# Patient Record
Sex: Male | Born: 1965 | Race: Black or African American | Hispanic: No | Marital: Married | Smoking: Never smoker
Health system: Southern US, Community
[De-identification: ages and names within clinical notes are randomized; demographics above are authoritative.]

## PROBLEM LIST (undated history)

## (undated) DIAGNOSIS — I10 Essential (primary) hypertension: Secondary | ICD-10-CM

## (undated) DIAGNOSIS — E785 Hyperlipidemia, unspecified: Secondary | ICD-10-CM

## (undated) DIAGNOSIS — I341 Nonrheumatic mitral (valve) prolapse: Secondary | ICD-10-CM

## (undated) DIAGNOSIS — E119 Type 2 diabetes mellitus without complications: Secondary | ICD-10-CM

## (undated) HISTORY — DX: Hyperlipidemia, unspecified: E78.5

## (undated) HISTORY — DX: Essential (primary) hypertension: I10

## (undated) HISTORY — PX: NO PAST SURGERIES: SHX2092

## (undated) HISTORY — DX: Type 2 diabetes mellitus without complications: E11.9

---

## 2014-10-14 ENCOUNTER — Encounter: Payer: Self-pay | Admitting: Family Medicine

## 2014-10-14 ENCOUNTER — Ambulatory Visit (INDEPENDENT_AMBULATORY_CARE_PROVIDER_SITE_OTHER): Payer: BLUE CROSS/BLUE SHIELD | Admitting: Family Medicine

## 2014-10-14 VITALS — BP 124/80 | HR 73 | Temp 97.9°F | Resp 18 | Ht 65.0 in | Wt 183.2 lb

## 2014-10-14 DIAGNOSIS — Z0000 Encounter for general adult medical examination without abnormal findings: Secondary | ICD-10-CM

## 2014-10-14 MED ORDER — ASPIRIN EC 81 MG PO TBEC: 81.0000 mg | DELAYED_RELEASE_TABLET | Freq: Every day | ORAL | Status: DC

## 2014-10-14 NOTE — Progress Notes (Signed)
Name: Jack Cardenas   MRN: [id number]    DOB: 1965/09/01   Date:10/14/2014       Progress Note  Subjective  Chief Complaint  Chief Complaint  Patient presents with  . Annual Exam    HPI  Patient presents for annual H&P. He is followed by endocrinologist for his diabetes hypertension hyperlipidemia and these are stable.  Past Medical History  Diagnosis Date  . Hyperlipidemia   . Hypertension   . Diabetes mellitus without complication     Social History  Substance Use Topics  . Smoking status: Never Smoker   . Smokeless tobacco: Never Used  . Alcohol Use: No     Current outpatient prescriptions:  .  amLODipine (NORVASC) 5 MG tablet, , Disp: , Rfl:  .  aspirin EC 81 MG tablet, Take 1 tablet (81 mg total) by mouth daily., Disp: 30 tablet, Rfl: 12 .  atorvastatin (LIPITOR) 40 MG tablet, , Disp: , Rfl:  .  LANTUS SOLOSTAR 100 UNIT/ML Solostar Pen, , Disp: , Rfl:  .  ramipril (ALTACE) 10 MG capsule, , Disp: , Rfl:  .  ZETIA 10 MG tablet, , Disp: , Rfl:   No Known Allergies  Review of Systems  Constitutional: Negative for fever, chills and weight loss.  HENT: Negative for congestion, hearing loss, sore throat and tinnitus.   Eyes: Negative for blurred vision, double vision and redness.  Respiratory: Negative for cough, hemoptysis and shortness of breath.   Cardiovascular: Negative for chest pain, palpitations, orthopnea, claudication and leg swelling.  Gastrointestinal: Negative for heartburn, nausea, vomiting, diarrhea, constipation and blood in stool.  Genitourinary: Negative for dysuria, urgency, frequency and hematuria.  Musculoskeletal: Negative for myalgias, back pain, joint pain, falls and neck pain.  Skin: Negative for itching.  Neurological: Negative for dizziness, tingling, tremors, focal weakness, seizures, loss of consciousness, weakness and headaches.  Endo/Heme/Allergies: Does not bruise/bleed easily.  Psychiatric/Behavioral: Negative for depression and  substance abuse. The patient is not nervous/anxious and does not have insomnia.      Objective  Filed Vitals:   10/14/14 0931  BP: 124/80  Pulse: 73  Temp: 97.9 F (36.6 C)  TempSrc: Oral  Resp: 18  Height:  (1.651 m)  Weight: 183 lb 3.2 oz (83.099 kg)  SpO2: 97%     Physical Exam  Constitutional: He is oriented to person, place, and time and well-developed, well-nourished, and in no distress.  HENT:  Head: Normocephalic.  Eyes: EOM are normal. Pupils are equal, round, and reactive to light.  Neck: Normal range of motion. Neck supple. No thyromegaly present.  Cardiovascular: Normal rate, regular rhythm and normal heart sounds.   No murmur heard. Pulmonary/Chest: Effort normal and breath sounds normal. No respiratory distress. He has no wheezes.  Abdominal: Soft. Bowel sounds are normal.  Genitourinary: Rectum normal, prostate normal and penis normal. Guaiac negative stool. No discharge found.  Musculoskeletal: Normal range of motion. He exhibits no edema.  Lymphadenopathy:    He has no cervical adenopathy.  Neurological: He is alert and oriented to person, place, and time. No cranial nerve deficit. Gait normal. Coordination normal.  Skin: Skin is warm and dry. No rash noted.  Psychiatric: Affect and judgment normal.      Assessment & Plan  1. Annual physical exam  - CBC - Comprehensive Metabolic Panel (CMET) - Lipid panel - PSA - Ambulatory referral to Colorectal Surgery

## 2014-10-14 NOTE — Patient Instructions (Signed)
Schedule for colonoscopy 

## 2014-10-15 LAB — COMPREHENSIVE METABOLIC PANEL
A/G RATIO: 2.1 (ref 1.1–2.5)
ALT: 35 IU/L (ref 0–44)
AST: 35 IU/L (ref 0–40)
Albumin: 4.6 g/dL (ref 3.5–5.5)
Alkaline Phosphatase: 72 IU/L (ref 39–117)
BILIRUBIN TOTAL: 0.8 mg/dL (ref 0.0–1.2)
BUN/Creatinine Ratio: 14 (ref 9–20)
BUN: 19 mg/dL (ref 6–24)
CHLORIDE: 101 mmol/L (ref 97–108)
CO2: 28 mmol/L (ref 18–29)
Calcium: 9.9 mg/dL (ref 8.7–10.2)
Creatinine, Ser: 1.36 mg/dL — ABNORMAL HIGH (ref 0.76–1.27)
GFR calc non Af Amer: 60 mL/min/{1.73_m2} (ref >59)
GFR, EST AFRICAN AMERICAN: 70 mL/min/{1.73_m2} (ref >59)
Globulin, Total: 2.2 g/dL (ref 1.5–4.5)
Glucose: 100 mg/dL — ABNORMAL HIGH (ref 65–99)
POTASSIUM: 5.1 mmol/L (ref 3.5–5.2)
SODIUM: 143 mmol/L (ref 134–144)
Total Protein: 6.8 g/dL (ref 6.0–8.5)

## 2014-10-15 LAB — CBC
Hematocrit: 45.9 % (ref 37.5–51.0)
Hemoglobin: 15 g/dL (ref 12.6–17.7)
MCH: 26 pg — AB (ref 26.6–33.0)
MCHC: 32.7 g/dL (ref 31.5–35.7)
MCV: 80 fL (ref 79–97)
PLATELETS: 176 10*3/uL (ref 150–379)
RBC: 5.77 x10E6/uL (ref 4.14–5.80)
RDW: 14.1 % (ref 12.3–15.4)
WBC: 3.5 10*3/uL (ref 3.4–10.8)

## 2014-10-15 LAB — PSA: Prostate Specific Ag, Serum: 0.5 ng/mL (ref 0.0–4.0)

## 2014-10-19 ENCOUNTER — Telehealth: Payer: Self-pay | Admitting: Gastroenterology

## 2014-10-19 ENCOUNTER — Other Ambulatory Visit: Payer: Self-pay

## 2014-10-19 NOTE — Telephone Encounter (Signed)
Gastroenterology Pre-Procedure Review  Request Date: 11-06-2014 Requesting Physician: Dr. Thana Ates  PATIENT REVIEW QUESTIONS: The patient responded to the following health history questions as indicated:    1. Are you having any GI issues? no 2. Do you have a personal history of Polyps? no 3. Do you have a family history of Colon Cancer or Polyps? no 4. Diabetes Mellitus? yes ( ) 5. Joint replacements in the past 12 months?no 6. Major health problems in the past 3 months?no 7. Any artificial heart valves, MVP, or defibrillator?no    MEDICATIONS & ALLERGIES:    Patient reports the following regarding taking any anticoagulation/antiplatelet therapy:   Plavix, Coumadin, Eliquis, Xarelto, Lovenox, Pradaxa, Brilinta, or Effient? no Aspirin? yes (Daily)  Patient confirms/reports the following medications:  Current Outpatient Prescriptions  Medication Sig Dispense Refill   amLODipine (NORVASC) 5 MG tablet      aspirin EC 81 MG tablet Take 1 tablet (81 mg total) by mouth daily. 30 tablet 12   atorvastatin (LIPITOR) 40 MG tablet      LANTUS SOLOSTAR 100 UNIT/ML Solostar Pen      ramipril (ALTACE) 10 MG capsule      ZETIA 10 MG tablet      No current facility-administered medications for this visit.    Patient confirms/reports the following allergies:  No Known Allergies  No orders of the defined types were placed in this encounter.    AUTHORIZATION INFORMATION Primary Insurance: 1D#: Group #:  Secondary Insurance: 1D#: Group #:  SCHEDULE INFORMATION: Date: 11-06-2014 Time: Location:[facility]

## 2014-12-07 ENCOUNTER — Encounter: Payer: Self-pay | Admitting: *Deleted

## 2014-12-09 NOTE — Discharge Instructions (Signed)

## 2014-12-11 ENCOUNTER — Encounter
Admission: RE | Disposition: A | Discharge status: Home / Self Care | Payer: Self-pay | Source: Ambulatory Visit | Attending: Gastroenterology

## 2014-12-11 ENCOUNTER — Ambulatory Visit: Payer: BLUE CROSS/BLUE SHIELD | Admitting: Anesthesiology

## 2014-12-11 ENCOUNTER — Ambulatory Visit
Admission: RE | Admit: 2014-12-11 | Discharge: 2014-12-11 | Disposition: A | Discharge status: Home / Self Care | Payer: BLUE CROSS/BLUE SHIELD | Source: Ambulatory Visit | Attending: Gastroenterology | Admitting: Gastroenterology

## 2014-12-11 DIAGNOSIS — Z794 Long term (current) use of insulin: Secondary | ICD-10-CM | POA: Diagnosis not present

## 2014-12-11 DIAGNOSIS — E119 Type 2 diabetes mellitus without complications: Secondary | ICD-10-CM | POA: Insufficient documentation

## 2014-12-11 DIAGNOSIS — Z79899 Other long term (current) drug therapy: Secondary | ICD-10-CM | POA: Diagnosis not present

## 2014-12-11 DIAGNOSIS — Z1211 Encounter for screening for malignant neoplasm of colon: Secondary | ICD-10-CM | POA: Diagnosis not present

## 2014-12-11 DIAGNOSIS — K640 First degree hemorrhoids: Secondary | ICD-10-CM | POA: Diagnosis not present

## 2014-12-11 DIAGNOSIS — Z7982 Long term (current) use of aspirin: Secondary | ICD-10-CM | POA: Insufficient documentation

## 2014-12-11 DIAGNOSIS — I10 Essential (primary) hypertension: Secondary | ICD-10-CM | POA: Diagnosis not present

## 2014-12-11 DIAGNOSIS — I341 Nonrheumatic mitral (valve) prolapse: Secondary | ICD-10-CM | POA: Diagnosis not present

## 2014-12-11 DIAGNOSIS — E785 Hyperlipidemia, unspecified: Secondary | ICD-10-CM | POA: Diagnosis not present

## 2014-12-11 HISTORY — PX: COLONOSCOPY WITH PROPOFOL: SHX5780

## 2014-12-11 HISTORY — DX: Nonrheumatic mitral (valve) prolapse: I34.1

## 2014-12-11 LAB — GLUCOSE, CAPILLARY: GLUCOSE-CAPILLARY: 88 mg/dL (ref 65–99)

## 2014-12-11 SURGERY — COLONOSCOPY WITH PROPOFOL
Anesthesia: Monitor Anesthesia Care

## 2014-12-11 MED ORDER — STERILE WATER FOR IRRIGATION IR SOLN
Status: DC | PRN
  Administered 2014-12-11: 08:00:00

## 2014-12-11 MED ORDER — LACTATED RINGERS IV SOLN
INTRAVENOUS | Status: DC
  Administered 2014-12-11 (×2): via INTRAVENOUS

## 2014-12-11 MED ORDER — PROPOFOL 10 MG/ML IV BOLUS
INTRAVENOUS | Status: DC | PRN
  Administered 2014-12-11: 40 mg via INTRAVENOUS
  Administered 2014-12-11: 100 mg via INTRAVENOUS
  Administered 2014-12-11: 20 mg via INTRAVENOUS
  Administered 2014-12-11: 40 mg via INTRAVENOUS

## 2014-12-11 MED ORDER — LIDOCAINE HCL (CARDIAC) 20 MG/ML IV SOLN
INTRAVENOUS | Status: DC | PRN
  Administered 2014-12-11: 30 mg via INTRAVENOUS

## 2014-12-11 SURGICAL SUPPLY — 29 items
CANISTER SUCT 1200ML W/VALVE (MISCELLANEOUS) ×2 IMPLANT
FCP ESCP3.2XJMB 240X2.8X (MISCELLANEOUS)
FORCEPS BIOP RAD 4 LRG CAP 4 (CUTTING FORCEPS) IMPLANT
FORCEPS BIOP RJ4 240 W/NDL (MISCELLANEOUS)
FORCEPS ESCP3.2XJMB 240X2.8X (MISCELLANEOUS) IMPLANT
GOWN CVR UNV OPN BCK APRN NK (MISCELLANEOUS) ×2 IMPLANT
GOWN ISOL THUMB LOOP REG UNIV (MISCELLANEOUS) ×2
HEMOCLIP INSTINCT (CLIP) IMPLANT
INJECTOR VARIJECT VIN23 (MISCELLANEOUS) IMPLANT
KIT CO2 TUBING (TUBING) IMPLANT
KIT DEFENDO VALVE AND CONN (KITS) IMPLANT
KIT ENDO PROCEDURE OLY (KITS) ×2 IMPLANT
KIT ROOM TURNOVER OR (KITS) ×2 IMPLANT
LIGATOR MULTIBAND 6SHOOTER MBL (MISCELLANEOUS) IMPLANT
MARKER SPOT ENDO TATTOO 5ML (MISCELLANEOUS) IMPLANT
PAD GROUND ADULT SPLIT (MISCELLANEOUS) IMPLANT
SNARE SHORT THROW 13M SML OVAL (MISCELLANEOUS) IMPLANT
SNARE SHORT THROW 30M LRG OVAL (MISCELLANEOUS) IMPLANT
SPOT EX ENDOSCOPIC TATTOO (MISCELLANEOUS)
SUCTION POLY TRAP 4CHAMBER (MISCELLANEOUS) IMPLANT
TRAP SUCTION POLY (MISCELLANEOUS) IMPLANT
TUBING CONN 6MMX3.1M (TUBING)
TUBING SUCTION CONN 0.25 STRL (TUBING) IMPLANT
UNDERPAD 30X60 958B10 (PK) (MISCELLANEOUS) IMPLANT
VALVE BIOPSY ENDO (VALVE) IMPLANT
VARIJECT INJECTOR VIN23 (MISCELLANEOUS)
WATER AUXILLARY (MISCELLANEOUS) IMPLANT
WATER STERILE IRR 250ML POUR (IV SOLUTION) ×2 IMPLANT
WATER STERILE IRR 500ML POUR (IV SOLUTION) IMPLANT

## 2014-12-11 NOTE — Anesthesia Postprocedure Evaluation (Signed)
  Anesthesia Post-op Note  Patient: Jack Cardenas  Procedure(s) Performed: Procedure(s) with comments: COLONOSCOPY WITH PROPOFOL (N/A) - Diabetic - insulin  Anesthesia type:MAC  Patient location: PACU  Post pain: Pain level controlled  Post assessment: Post-op Vital signs reviewed, Patient's Cardiovascular Status Stable, Respiratory Function Stable, Patent Airway and No signs of Nausea or vomiting  Post vital signs: Reviewed and stable  Last Vitals:  Filed Vitals:   12/11/14 0810  BP:   Pulse: 74  Temp:   Resp: 14    Level of consciousness: awake, alert  and patient cooperative  Complications: No apparent anesthesia complications

## 2014-12-11 NOTE — Anesthesia Preprocedure Evaluation (Signed)
Anesthesia Evaluation  Patient identified by MRN, date of birth, ID band Patient awake    Reviewed: Allergy & Precautions, H&P , NPO status , Patient's Chart, lab work & pertinent test results, reviewed documented beta blocker date and time   Airway Mallampati: II  TM Distance: >3 FB Neck ROM: full    Dental no notable dental hx.    Pulmonary neg pulmonary ROS,    Pulmonary exam normal breath sounds clear to auscultation       Cardiovascular Exercise Tolerance: Good hypertension,  Rhythm:regular Rate:Normal     Neuro/Psych negative neurological ROS  negative psych ROS   GI/Hepatic negative GI ROS, Neg liver ROS,   Endo/Other  diabetes  Renal/GU negative Renal ROS  negative genitourinary   Musculoskeletal   Abdominal   Peds  Hematology negative hematology ROS (+)   Anesthesia Other Findings   Reproductive/Obstetrics negative OB ROS                             Anesthesia Physical Anesthesia Plan  ASA: II  Anesthesia Plan: MAC   Post-op Pain Management:    Induction:   Airway Management Planned:   Additional Equipment:   Intra-op Plan:   Post-operative Plan:   Informed Consent: I have reviewed the patients History and Physical, chart, labs and discussed the procedure including the risks, benefits and alternatives for the proposed anesthesia with the patient or authorized representative who has indicated his/her understanding and acceptance.   Dental Advisory Given  Plan Discussed with: CRNA  Anesthesia Plan Comments:         Anesthesia Quick Evaluation  

## 2014-12-11 NOTE — H&P (Signed)
  [facility]  [address] [place], [place] [address] Phone: [phone] Fax : [phone]  Primary Care Physician:  Jack MascotLemont Morrisey, MD Primary Gastroenterologist:  Dr. Servando SnareWohl  Pre-Procedure History & Physical: HPI:  Jack Cardenas is a 49 y.o. male is here for a screening colonoscopy.   Past Medical History  Diagnosis Date  . Hyperlipidemia   . Hypertension   . Diabetes mellitus without complication (HCC)   . MVP (mitral valve prolapse)     Dx'd as teenager. No issues.  Followed by PCP.    Past Surgical History  Procedure Laterality Date  . No past surgeries      Prior to Admission medications   Medication Sig Start Date End Date Taking? Authorizing Provider  amLODipine (NORVASC) 5 MG tablet  07/14/14  Yes Historical Provider, MD  aspirin EC 81 MG tablet Take 1 tablet (81 mg total) by mouth daily. 10/14/14  Yes Jack MascotLemont Morrisey, MD  atorvastatin (LIPITOR) 40 MG tablet  07/14/14  Yes Historical Provider, MD  Cholecalciferol (VITAMIN D PO) Take by mouth.   Yes Historical Provider, MD  Multiple Vitamin (MULTIVITAMIN) tablet Take 1 tablet by mouth daily.   Yes Historical Provider, MD  Omega-3 Fatty Acids (FISH OIL PO) Take by mouth.   Yes Historical Provider, MD  ZETIA 10 MG tablet  07/14/14  Yes Historical Provider, MD  LANTUS SOLOSTAR 100 UNIT/ML Solostar Pen  07/14/14   Historical Provider, MD  ramipril (ALTACE) 10 MG capsule  07/14/14   Historical Provider, MD    Allergies as of 10/19/2014  . (No Known Allergies)    History reviewed. No pertinent family history.  Social History   Social History  . Marital Status: Married    Spouse Name: N/A  . Number of Children: N/A  . Years of Education: N/A   Occupational History  . Not on file.   Social History Main Topics  . Smoking status: Never Smoker   . Smokeless tobacco: Never Used  . Alcohol Use: No  . Drug Use: No  . Sexual Activity:    Partners: Female   Other Topics Concern  . Not on file     Social History Narrative    Review of Systems: See HPI, otherwise negative ROS  Physical Exam: BP 129/96 mmHg  Pulse 74  Temp(Src) 97.9 F (36.6 C) (Temporal)  Resp 17  Ht 5\' 5"  (1.651 m)  Wt 177 lb (80.287 kg)  BMI 29.45 kg/m2  SpO2 99% General:   Alert,  pleasant and cooperative in NAD Head:  Normocephalic and atraumatic. Neck:  Supple; no masses or thyromegaly. Lungs:  Clear throughout to auscultation.    Heart:  Regular rate and rhythm. Abdomen:  Soft, nontender and nondistended. Normal bowel sounds, without guarding, and without rebound.   Neurologic:  Alert and  oriented x4;  grossly normal neurologically.  Impression/Plan: Jack Roersorris Fitchett is now here to undergo a screening colonoscopy.  Risks, benefits, and alternatives regarding colonoscopy have been reviewed with the patient.  Questions have been answered.  All parties agreeable.

## 2014-12-11 NOTE — Transfer of Care (Signed)
Immediate Anesthesia Transfer of Care Note  Patient: Jack Cardenas  Procedure(s) Performed: Procedure(s) with comments: COLONOSCOPY WITH PROPOFOL (N/A) - Diabetic - insulin  Patient Location: PACU  Anesthesia Type: MAC  Level of Consciousness: awake, alert  and patient cooperative  Airway and Oxygen Therapy: Patient Spontanous Breathing and Patient connected to supplemental oxygen  Post-op Assessment: Post-op Vital signs reviewed, Patient's Cardiovascular Status Stable, Respiratory Function Stable, Patent Airway and No signs of Nausea or vomiting  Post-op Vital Signs: Reviewed and stable  Complications: No apparent anesthesia complications

## 2014-12-11 NOTE — Op Note (Signed)
[facility] Patient Name: Jack Cardenas Procedure Date: 12/11/2014 7:35 AM MRN: [id number] Account #: [id number] Date of Birth: [birth year] Admit Type: Outpatient Age: 49 Room: [facility] OR ROOM 01 Gender: Male Note Status: Finalized Procedure:         Colonoscopy Indications:       Screening for colorectal malignant neoplasm Providers:         Midge Minium, MD Referring MD:      Dennison Mascot, MD (Referring MD) Medicines:         Propofol per Anesthesia Complications:     No immediate complications. Procedure:         Pre-Anesthesia Assessment:                    - Prior to the procedure, a History and Physical was                     performed, and patient medications and allergies were                     reviewed. The patient's tolerance of previous anesthesia                     was also reviewed. The risks and benefits of the procedure                     and the sedation options and risks were discussed with the                     patient. All questions were answered, and informed consent                     was obtained. Prior Anticoagulants: The patient has taken                     no previous anticoagulant or antiplatelet agents. ASA                     Grade Assessment: II - A patient with mild systemic                     disease. After reviewing the risks and benefits, the                     patient was deemed in satisfactory condition to undergo                     the procedure.                    After obtaining informed consent, the colonoscope was                     passed under direct vision. Throughout the procedure, the                     patient's blood pressure, pulse, and oxygen saturations                     were monitored continuously. The Olympus CF H180AL                     colonoscope (S#: [id number]) was introduced through the anus  and advanced to the the cecum, identified by appendiceal            orifice and ileocecal valve. The colonoscopy was performed                     without difficulty. The patient tolerated the procedure                     well. The quality of the bowel preparation was excellent. Findings:      The perianal and digital rectal examinations were normal.      Non-bleeding internal hemorrhoids were found during retroflexion. The       hemorrhoids were Grade I (internal hemorrhoids that do not prolapse). Impression:        - Non-bleeding internal hemorrhoids.                    - No specimens collected. Recommendation:    - Repeat colonoscopy in 10 years for screening unless any                     change in family history or lower GI problems. Procedure Code(s): --- Professional ---                    [id number], Colonoscopy, flexible; diagnostic, including                     collection of specimen(s) by brushing or washing, when                     performed (separate procedure) Diagnosis Code(s): --- Professional ---                    Z12.11, Encounter for screening for malignant neoplasm of                     colon CPT copyright 2014 American Medical Association. All rights reserved. The codes documented in this report are preliminary and upon coder review may  be revised to meet current compliance requirements. Midge Miniumarren Traevon Meiring, MD 12/11/2014 7:52:37 AM This report has been signed electronically. Number of Addenda: 0 Note Initiated On: 12/11/2014 7:35 AM Scope Withdrawal Time: 0 hours 6 minutes 34 seconds  Total Procedure Duration: 0 hours 11 minutes 7 seconds       Jack Cardenas

## 2014-12-11 NOTE — Anesthesia Procedure Notes (Signed)
Procedure Name: MAC Performed by: Haruo Stepanek Pre-anesthesia Checklist: Patient identified, Emergency Drugs available, Suction available, Patient being monitored and Timeout performed Patient Re-evaluated:Patient Re-evaluated prior to inductionOxygen Delivery Method: Nasal cannula       

## 2014-12-14 ENCOUNTER — Encounter: Payer: Self-pay | Admitting: Gastroenterology

## 2015-05-27 DIAGNOSIS — I10 Essential (primary) hypertension: Secondary | ICD-10-CM | POA: Diagnosis not present

## 2015-05-27 DIAGNOSIS — E559 Vitamin D deficiency, unspecified: Secondary | ICD-10-CM | POA: Diagnosis not present

## 2015-05-27 DIAGNOSIS — E782 Mixed hyperlipidemia: Secondary | ICD-10-CM | POA: Diagnosis not present

## 2015-05-27 DIAGNOSIS — E119 Type 2 diabetes mellitus without complications: Secondary | ICD-10-CM | POA: Diagnosis not present

## 2015-06-29 DIAGNOSIS — H524 Presbyopia: Secondary | ICD-10-CM | POA: Diagnosis not present

## 2015-06-29 DIAGNOSIS — E089 Diabetes mellitus due to underlying condition without complications: Secondary | ICD-10-CM | POA: Diagnosis not present

## 2015-06-29 DIAGNOSIS — H04123 Dry eye syndrome of bilateral lacrimal glands: Secondary | ICD-10-CM | POA: Diagnosis not present

## 2015-06-29 DIAGNOSIS — E119 Type 2 diabetes mellitus without complications: Secondary | ICD-10-CM | POA: Diagnosis not present

## 2015-08-26 DIAGNOSIS — E119 Type 2 diabetes mellitus without complications: Secondary | ICD-10-CM | POA: Diagnosis not present

## 2015-08-26 DIAGNOSIS — E782 Mixed hyperlipidemia: Secondary | ICD-10-CM | POA: Diagnosis not present

## 2015-08-26 DIAGNOSIS — I10 Essential (primary) hypertension: Secondary | ICD-10-CM | POA: Diagnosis not present

## 2015-08-26 DIAGNOSIS — E559 Vitamin D deficiency, unspecified: Secondary | ICD-10-CM | POA: Diagnosis not present

## 2015-10-18 ENCOUNTER — Encounter: Payer: BLUE CROSS/BLUE SHIELD | Admitting: Family Medicine

## 2015-10-27 ENCOUNTER — Ambulatory Visit (INDEPENDENT_AMBULATORY_CARE_PROVIDER_SITE_OTHER): Payer: BLUE CROSS/BLUE SHIELD | Admitting: Family Medicine

## 2015-10-27 ENCOUNTER — Encounter: Payer: Self-pay | Admitting: Family Medicine

## 2015-10-27 DIAGNOSIS — Z0000 Encounter for general adult medical examination without abnormal findings: Secondary | ICD-10-CM | POA: Diagnosis not present

## 2015-10-27 NOTE — Progress Notes (Signed)
Name: Jack Cardenas   MRN: [id number]    DOB: 12/30/1965   Date:10/27/2015       Progress Note  Subjective  Chief Complaint  Chief Complaint  Patient presents with  . Annual Exam    CPE  This patient usually follows with Dr. Thana AtesMorrisey, new to me  HPI  Pt. Presents for a Complete Physical Exam.  Last colonoscopy was in 2016, was reportedly normal.  Rectal exam performed at time of last physical.  Past Medical History:  Diagnosis Date  . Diabetes mellitus without complication (HCC)   . Hyperlipidemia   . Hypertension   . MVP (mitral valve prolapse)    Dx'd as teenager. No issues.  Followed by PCP.    Past Surgical History:  Procedure Laterality Date  . COLONOSCOPY WITH PROPOFOL N/A 12/11/2014   Procedure: COLONOSCOPY WITH PROPOFOL;  Surgeon: Midge Miniumarren Wohl, MD;  [facility];  Service: Endoscopy;  Laterality: N/A;  Diabetic - insulin  . NO PAST SURGERIES      History reviewed. No pertinent family history.  Social History   Social History  . Marital status: Married    Spouse name: N/A  . Number of children: N/A  . Years of education: N/A   Occupational History  . Not on file.   Social History Main Topics  . Smoking status: Never Smoker  . Smokeless tobacco: Never Used  . Alcohol use No  . Drug use: No  . Sexual activity: Yes    Partners: Female   Other Topics Concern  . Not on file   Social History Narrative  . No narrative on file     Current Outpatient Prescriptions:  .  amLODipine (NORVASC) 5 MG tablet, , Disp: , Rfl:  .  aspirin EC 81 MG tablet, Take 1 tablet (81 mg total) by mouth daily., Disp: 30 tablet, Rfl: 12 .  atorvastatin (LIPITOR) 40 MG tablet, , Disp: , Rfl:  .  Cholecalciferol (VITAMIN D PO), Take by mouth., Disp: , Rfl:  .  LANTUS SOLOSTAR 100 UNIT/ML Solostar Pen, , Disp: , Rfl:  .  Multiple Vitamin (MULTIVITAMIN) tablet, Take 1 tablet by mouth daily., Disp: , Rfl:  .  Omega-3 Fatty Acids (FISH OIL PO), Take by mouth.,  Disp: , Rfl:  .  ramipril (ALTACE) 10 MG capsule, , Disp: , Rfl:  .  ZETIA 10 MG tablet, , Disp: , Rfl:   No Known Allergies   Review of Systems  Constitutional: Negative for chills, fever and malaise/fatigue.  HENT: Negative for sore throat.   Eyes: Negative for blurred vision and double vision.  Respiratory: Negative for cough and shortness of breath.   Cardiovascular: Negative for chest pain and palpitations.  Gastrointestinal: Negative for abdominal pain, blood in stool, constipation, diarrhea, heartburn, nausea and vomiting.  Genitourinary: Negative for dysuria and frequency.  Musculoskeletal: Negative for back pain, myalgias and neck pain.  Neurological: Negative for headaches.  Psychiatric/Behavioral: Negative for depression. The patient is not nervous/anxious.      Objective  Vitals:   10/27/15 1146  BP: 108/68  Pulse: 69  Resp: 16  Temp: 98.1 F (36.7 C)  TempSrc: Oral  SpO2: 96%  Weight: 182 lb 14.4 oz (83 kg)  Height: 5\' 5"  (1.651 m)    Physical Exam  Constitutional: He is oriented to person, place, and time and well-developed, well-nourished, and in no distress.  HENT:  Head: Normocephalic and atraumatic.  Cardiovascular: Normal rate, regular rhythm and normal heart sounds.  No murmur heard. Pulmonary/Chest: Effort normal and breath sounds normal. He has no wheezes.  Abdominal: Soft. Bowel sounds are normal. There is no tenderness.  Genitourinary: Rectum normal and prostate normal.  Neurological: He is alert and oriented to person, place, and time.  Skin: Skin is warm and dry.  Psychiatric: Mood, memory, affect and judgment normal.  Nursing note and vitals reviewed.   Assessment & Plan  1. Annual physical exam EKG is normal sinus rhythm, obtain PSA, patient obtains lab work at the endocrinologist - PSA - EKG 12-Lead   Jack Cardenas A. Faylene Kurtz Medical [facility] 10/27/2015 11:51 AM

## 2015-11-26 DIAGNOSIS — E119 Type 2 diabetes mellitus without complications: Secondary | ICD-10-CM | POA: Diagnosis not present

## 2015-11-26 DIAGNOSIS — Z23 Encounter for immunization: Secondary | ICD-10-CM | POA: Diagnosis not present

## 2015-11-26 DIAGNOSIS — E559 Vitamin D deficiency, unspecified: Secondary | ICD-10-CM | POA: Diagnosis not present

## 2015-11-26 DIAGNOSIS — I10 Essential (primary) hypertension: Secondary | ICD-10-CM | POA: Diagnosis not present

## 2015-11-26 DIAGNOSIS — E782 Mixed hyperlipidemia: Secondary | ICD-10-CM | POA: Diagnosis not present

## 2016-03-03 DIAGNOSIS — E559 Vitamin D deficiency, unspecified: Secondary | ICD-10-CM | POA: Insufficient documentation

## 2016-03-03 DIAGNOSIS — I10 Essential (primary) hypertension: Secondary | ICD-10-CM | POA: Insufficient documentation

## 2016-03-03 DIAGNOSIS — E119 Type 2 diabetes mellitus without complications: Secondary | ICD-10-CM | POA: Diagnosis not present

## 2016-03-03 DIAGNOSIS — I129 Hypertensive chronic kidney disease with stage 1 through stage 4 chronic kidney disease, or unspecified chronic kidney disease: Secondary | ICD-10-CM | POA: Insufficient documentation

## 2016-03-03 DIAGNOSIS — Z794 Long term (current) use of insulin: Secondary | ICD-10-CM

## 2016-03-03 DIAGNOSIS — E782 Mixed hyperlipidemia: Secondary | ICD-10-CM | POA: Insufficient documentation

## 2016-05-31 DIAGNOSIS — Z794 Long term (current) use of insulin: Secondary | ICD-10-CM | POA: Diagnosis not present

## 2016-05-31 DIAGNOSIS — E559 Vitamin D deficiency, unspecified: Secondary | ICD-10-CM | POA: Diagnosis not present

## 2016-05-31 DIAGNOSIS — E782 Mixed hyperlipidemia: Secondary | ICD-10-CM | POA: Diagnosis not present

## 2016-05-31 DIAGNOSIS — E119 Type 2 diabetes mellitus without complications: Secondary | ICD-10-CM | POA: Diagnosis not present

## 2016-06-02 DIAGNOSIS — I10 Essential (primary) hypertension: Secondary | ICD-10-CM | POA: Diagnosis not present

## 2016-06-02 DIAGNOSIS — E119 Type 2 diabetes mellitus without complications: Secondary | ICD-10-CM | POA: Diagnosis not present

## 2016-06-02 DIAGNOSIS — E559 Vitamin D deficiency, unspecified: Secondary | ICD-10-CM | POA: Diagnosis not present

## 2016-06-02 DIAGNOSIS — E782 Mixed hyperlipidemia: Secondary | ICD-10-CM | POA: Diagnosis not present

## 2016-06-21 DIAGNOSIS — E089 Diabetes mellitus due to underlying condition without complications: Secondary | ICD-10-CM | POA: Diagnosis not present

## 2016-06-21 DIAGNOSIS — H524 Presbyopia: Secondary | ICD-10-CM | POA: Diagnosis not present

## 2016-06-21 DIAGNOSIS — E119 Type 2 diabetes mellitus without complications: Secondary | ICD-10-CM | POA: Diagnosis not present

## 2016-08-29 DIAGNOSIS — Z794 Long term (current) use of insulin: Secondary | ICD-10-CM | POA: Diagnosis not present

## 2016-08-29 DIAGNOSIS — E119 Type 2 diabetes mellitus without complications: Secondary | ICD-10-CM | POA: Diagnosis not present

## 2016-08-29 DIAGNOSIS — E782 Mixed hyperlipidemia: Secondary | ICD-10-CM | POA: Diagnosis not present

## 2016-09-01 DIAGNOSIS — I10 Essential (primary) hypertension: Secondary | ICD-10-CM | POA: Diagnosis not present

## 2016-09-01 DIAGNOSIS — E119 Type 2 diabetes mellitus without complications: Secondary | ICD-10-CM | POA: Diagnosis not present

## 2016-09-01 DIAGNOSIS — E559 Vitamin D deficiency, unspecified: Secondary | ICD-10-CM | POA: Diagnosis not present

## 2016-09-01 DIAGNOSIS — E782 Mixed hyperlipidemia: Secondary | ICD-10-CM | POA: Diagnosis not present

## 2016-10-27 ENCOUNTER — Encounter: Payer: Self-pay | Admitting: Family Medicine

## 2016-10-27 ENCOUNTER — Ambulatory Visit (INDEPENDENT_AMBULATORY_CARE_PROVIDER_SITE_OTHER): Payer: BLUE CROSS/BLUE SHIELD | Admitting: Family Medicine

## 2016-10-27 VITALS — BP 126/82 | HR 82 | Temp 98.1°F | Resp 16 | Ht 65.0 in | Wt 186.5 lb

## 2016-10-27 DIAGNOSIS — Z0000 Encounter for general adult medical examination without abnormal findings: Secondary | ICD-10-CM

## 2016-10-27 LAB — COMPLETE METABOLIC PANEL WITH GFR
AG Ratio: 2 (calc) (ref 1.0–2.5)
ALKALINE PHOSPHATASE (APISO): 62 U/L (ref 40–115)
ALT: 30 U/L (ref 9–46)
AST: 27 U/L (ref 10–35)
Albumin: 4.4 g/dL (ref 3.6–5.1)
BUN: 19 mg/dL (ref 7–25)
CHLORIDE: 104 mmol/L (ref 98–110)
CO2: 28 mmol/L (ref 20–32)
CREATININE: 1.3 mg/dL (ref 0.70–1.33)
Calcium: 9.2 mg/dL (ref 8.6–10.3)
GFR, Est African American: 73 mL/min/{1.73_m2} (ref >=60)
GFR, Est Non African American: 63 mL/min/{1.73_m2} (ref >=60)
GLOBULIN: 2.2 g/dL (ref 1.9–3.7)
Glucose, Bld: 59 mg/dL — ABNORMAL LOW (ref 65–99)
POTASSIUM: 4.1 mmol/L (ref 3.5–5.3)
SODIUM: 141 mmol/L (ref 135–146)
Total Bilirubin: 0.8 mg/dL (ref 0.2–1.2)
Total Protein: 6.6 g/dL (ref 6.1–8.1)

## 2016-10-27 LAB — CBC WITH DIFFERENTIAL/PLATELET
BASOS ABS: 19 {cells}/uL (ref 0–200)
Basophils Relative: 0.5 %
EOS PCT: 1.3 %
Eosinophils Absolute: 49 cells/uL (ref 15–500)
HEMATOCRIT: 45.3 % (ref 38.5–50.0)
Hemoglobin: 14.6 g/dL (ref 13.2–17.1)
LYMPHS ABS: 1387 {cells}/uL (ref 850–3900)
MCH: 25.6 pg — ABNORMAL LOW (ref 27.0–33.0)
MCHC: 32.2 g/dL (ref 32.0–36.0)
MCV: 79.5 fL — ABNORMAL LOW (ref 80.0–100.0)
MONOS PCT: 10.9 %
MPV: 11.9 fL (ref 7.5–12.5)
NEUTROS PCT: 50.8 %
Neutro Abs: 1930 cells/uL (ref 1500–7800)
Platelets: 176 10*3/uL (ref 140–400)
RBC: 5.7 10*6/uL (ref 4.20–5.80)
RDW: 13.4 % (ref 11.0–15.0)
Total Lymphocyte: 36.5 %
WBC mixed population: 414 cells/uL (ref 200–950)
WBC: 3.8 10*3/uL (ref 3.8–10.8)

## 2016-10-27 LAB — TSH: TSH: 1.77 m[IU]/L (ref 0.40–4.50)

## 2016-10-27 LAB — PSA: PSA: 4.1 ng/mL — AB (ref <=4.0)

## 2016-10-27 NOTE — Progress Notes (Signed)
Name: Jack Cardenas   MRN: [id number]    DOB: [birth year]   Date:10/27/2016       Progress Note  Subjective  Chief Complaint  Chief Complaint  Patient presents with  . Annual Exam    HPI  Pt. Presents for Complete Physical Exam.  Screening colonoscopy in November 2016, repeat in 2026. He is due for prostate cancer screening.    Past Medical History:  Diagnosis Date  . Diabetes mellitus without complication (HCC)   . Hyperlipidemia   . Hypertension   . MVP (mitral valve prolapse)    Dx'd as teenager. No issues.  Followed by PCP.    Past Surgical History:  Procedure Laterality Date  . COLONOSCOPY WITH PROPOFOL N/A 12/11/2014   Procedure: COLONOSCOPY WITH PROPOFOL;  Surgeon: Midge Minium, MD;  Location[facility];  Service: Endoscopy;  Laterality: N/A;  Diabetic - insulin  . NO PAST SURGERIES      Family History  Problem Relation Age of Onset  . Renal Disease Father   . Diabetes Father   . Thyroid disease Sister   . Metabolic syndrome Sister   . Eczema Sister     Social History   Social History  . Marital status: Married    Spouse name: N/A  . Number of children: N/A  . Years of education: N/A   Occupational History  . Not on file.   Social History Main Topics  . Smoking status: Never Smoker  . Smokeless tobacco: Never Used  . Alcohol use No  . Drug use: No  . Sexual activity: Yes    Partners: Female   Other Topics Concern  . Not on file   Social History Narrative  . No narrative on file     Current Outpatient Prescriptions:  .  ACCU-CHEK FASTCLIX LANCETS MISC, Use to check blood sugars 3 times a day, Disp: , Rfl:  .  amLODipine (NORVASC) 5 MG tablet, Take 1 tablet once a day for blood pressure, Disp: , Rfl:  .  aspirin EC 81 MG tablet, Take 1 tablet (81 mg total) by mouth daily., Disp: 30 tablet, Rfl: 12 .  atorvastatin (LIPITOR) 40 MG tablet, Take 1 tablet by mouth once a day, Disp: , Rfl:  .  Blood Glucose Monitoring Suppl (GLUCOCOM  BLOOD GLUCOSE MONITOR) DEVI, Use to test blood sugars four times a day, Disp: , Rfl:  .  Cholecalciferol (VITAMIN D3) 2000 units capsule, Take 1 capsule once a day, Disp: , Rfl:  .  ezetimibe (ZETIA) 10 MG tablet, Take 1 tablet once a day for cholesterol, Disp: , Rfl:  .  glucose blood (ACCU-CHEK AVIVA PLUS) test strip, Use to check blood sugars 3 times a day, Disp: , Rfl:  .  Insulin Glargine (LANTUS SOLOSTAR) 100 UNIT/ML Solostar Pen, Inject into the skin., Disp: , Rfl:  .  Insulin Pen Needle (BD PEN NEEDLE NANO U/F) 32G X 4 MM MISC, Use as directed for insulin injection once a day, Disp: , Rfl:  .  Multiple Vitamin (MULTIVITAMIN) tablet, Take 1 tablet by mouth daily., Disp: , Rfl:  .  nateglinide (STARLIX) 120 MG tablet, Take 1 tablet by mouth daily before supper., Disp: , Rfl:  .  Omega-3 Fatty Acids (FISH OIL PO), Take by mouth., Disp: , Rfl:  .  ONETOUCH VERIO test strip, , Disp: , Rfl:  .  ramipril (ALTACE) 10 MG capsule, , Disp: , Rfl:   No Known Allergies   Review of Systems  Constitutional: Negative  for chills, fever and malaise/fatigue.  HENT: Negative for congestion, ear pain and sore throat.   Eyes: Negative for blurred vision and double vision.  Respiratory: Negative for cough and sputum production.   Cardiovascular: Negative for chest pain, palpitations and leg swelling.  Gastrointestinal: Negative for abdominal pain, blood in stool, constipation, diarrhea, nausea and vomiting.  Genitourinary: Negative for dysuria and hematuria.  Musculoskeletal: Negative for back pain and neck pain.  Skin: Negative for rash.  Neurological: Negative for dizziness and headaches.  Psychiatric/Behavioral: Negative for depression. The patient is not nervous/anxious and does not have insomnia.      Objective  Vitals:   10/27/16 0921  BP: 126/82  Pulse: 82  Resp: 16  Temp: 98.1 F (36.7 C)  TempSrc: Oral  SpO2: 97%  Weight: 186 lb 8 oz (84.6 kg)  Height:  (1.651 m)     Physical Exam  Constitutional: He is oriented to person, place, and time and well-developed, well-nourished, and in no distress.  HENT:  Head: Normocephalic and atraumatic.  Right Ear: External ear normal.  Left Ear: External ear normal.  Mouth/Throat: Oropharynx is clear and moist.  Eyes: Pupils are equal, round, and reactive to light.  Neck: Neck supple. No thyromegaly present.  Cardiovascular: Normal rate, regular rhythm and normal heart sounds.   No murmur heard. Pulmonary/Chest: Effort normal and breath sounds normal. He has no wheezes.  Abdominal: Soft. Bowel sounds are normal. There is no tenderness.  Genitourinary: Rectum normal. Prostate is not enlarged and not tender.  Musculoskeletal: He exhibits no edema.  Neurological: He is alert and oriented to person, place, and time.  Psychiatric: Mood, memory, affect and judgment normal.  Nursing note and vitals reviewed.       Assessment & Plan  1. Annual physical exam Obtain age-appropriate laboratory screenings - CBC with Differential/Platelet - COMPLETE METABOLIC PANEL WITH GFR - TSH - PSA    Susan Bleich Asad A. Faylene Kurtz Medical [facility] 10/27/2016 9:41 AM

## 2016-10-31 ENCOUNTER — Telehealth: Payer: Self-pay

## 2016-10-31 DIAGNOSIS — R972 Elevated prostate specific antigen [PSA]: Secondary | ICD-10-CM

## 2016-10-31 NOTE — Telephone Encounter (Signed)
-----   Message from Ellyn Hack, MD sent at 10/30/2016  6:06 PM EDT ----- CBC: Normal white count, hemoglobin, hematocrit and platelets PSA is elevated at 4.1 ng/mL, he should be referred to urology for further management of elevated PSA. Please confirm and will place the referral

## 2016-10-31 NOTE — Telephone Encounter (Signed)
Called pt no answer. LM informing pt of the results below. Referral placed. Also sent text for mychart sign up.

## 2016-11-20 ENCOUNTER — Ambulatory Visit (INDEPENDENT_AMBULATORY_CARE_PROVIDER_SITE_OTHER): Payer: BLUE CROSS/BLUE SHIELD | Admitting: Urology

## 2016-11-20 ENCOUNTER — Encounter: Payer: Self-pay | Admitting: Urology

## 2016-11-20 VITALS — BP 147/76 | HR 70 | Ht 65.0 in | Wt 187.0 lb

## 2016-11-20 DIAGNOSIS — R972 Elevated prostate specific antigen [PSA]: Secondary | ICD-10-CM

## 2016-11-20 DIAGNOSIS — E785 Hyperlipidemia, unspecified: Secondary | ICD-10-CM | POA: Insufficient documentation

## 2016-11-20 DIAGNOSIS — E1169 Type 2 diabetes mellitus with other specified complication: Secondary | ICD-10-CM | POA: Insufficient documentation

## 2016-11-20 LAB — URINALYSIS, COMPLETE
BILIRUBIN UA: NEGATIVE
Glucose, UA: NEGATIVE
Ketones, UA: NEGATIVE
Leukocytes, UA: NEGATIVE
Nitrite, UA: NEGATIVE
PH UA: 7 (ref 5.0–7.5)
PROTEIN UA: NEGATIVE
RBC, UA: NEGATIVE
Specific Gravity, UA: 1.01 (ref 1.005–1.030)
Urobilinogen, Ur: 0.2 mg/dL (ref 0.2–1.0)

## 2016-11-20 MED ORDER — TAMSULOSIN HCL 0.4 MG PO CAPS: 0.4000 mg | ORAL_CAPSULE | Freq: Every day | ORAL | 0 refills | Status: DC

## 2016-11-20 NOTE — Progress Notes (Signed)
11/20/2016 8:11 AM   Jack Cardenas 03/21/1965 [id number]   Chief Complaint  Patient presents with  . Elevated PSA    New Patient    HPI: Jack Cardenas is a 51 y.o. male who presents today in consultation at the request of Dr. Sherryll Burger for evaluation of an elevated PSA.  He had his annual physical in September 2018 and PSA was elevated at 4.1.  Previous PSA September 2016 was 0.5.  He denies previous history of urologic problems.  He has no bothersome lower urinary tract symptoms.  Denies dysuria or gross hematuria.  Denies flank, abdominal, pelvic or scrotal pain.  The severity of his problem is rated mild.   PMH: Past Medical History:  Diagnosis Date  . Diabetes mellitus without complication (HCC)   . Hyperlipidemia   . Hypertension   . MVP (mitral valve prolapse)    Dx'd as teenager. No issues.  Followed by PCP.    Surgical History: Past Surgical History:  Procedure Laterality Date  . COLONOSCOPY WITH PROPOFOL N/A 12/11/2014   Procedure: COLONOSCOPY WITH PROPOFOL;  Surgeon: Midge Minium, MD;  Location: [facility];  Service: Endoscopy;  Laterality: N/A;  Diabetic - insulin  . NO PAST SURGERIES      Home Medications:  Allergies as of 11/20/2016   No Known Allergies     Medication List       Accurate as of 11/20/16 11:59 PM. Always use your most recent med list.          ACCU-CHEK FASTCLIX LANCETS Misc Use to check blood sugars 3 times a day   amLODipine 5 MG tablet Commonly known as:  NORVASC Take 1 tablet once a day for blood pressure   aspirin EC 81 MG tablet Take 1 tablet (81 mg total) by mouth daily.   atorvastatin 40 MG tablet Commonly known as:  LIPITOR Take 1 tablet by mouth once a day   BD PEN NEEDLE NANO U/F 32G X 4 MM Misc Generic drug:  Insulin Pen Needle Use as directed for insulin injection once a day   ezetimibe 10 MG tablet Commonly known as:  ZETIA Take 1 tablet once a day for cholesterol   FISH OIL PO Take by mouth.     GLUCOCOM BLOOD GLUCOSE MONITOR Devi Use to test blood sugars four times a day   LANTUS SOLOSTAR 100 UNIT/ML Solostar Pen Generic drug:  Insulin Glargine Inject into the skin.   multivitamin tablet Take 1 tablet by mouth daily.   nateglinide 120 MG tablet Commonly known as:  STARLIX Take 1 tablet by mouth daily before supper.   ACCU-CHEK AVIVA PLUS test strip Generic drug:  glucose blood Use to check blood sugars 3 times a day   ONETOUCH VERIO test strip Generic drug:  glucose blood   ramipril 10 MG capsule Commonly known as:  ALTACE   tamsulosin 0.4 MG Caps capsule Commonly known as:  FLOMAX Take 1 capsule (0.4 mg total) by mouth daily.   Vitamin D3 2000 units capsule Take 1 capsule once a day       Allergies: No Known Allergies  Family History: Family History  Problem Relation Age of Onset  . Renal Disease Father   . Diabetes Father   . Thyroid disease Sister   . Metabolic syndrome Sister   . Eczema Sister   . Prostate cancer Paternal Grandfather     Social History:  reports that he has never smoked. He has never used smokeless tobacco. He reports  that he does not drink alcohol or use drugs.  ROS: UROLOGY Frequent Urination?: No Hard to postpone urination?: No Burning/pain with urination?: No Get up at night to urinate?: No Leakage of urine?: No Urine stream starts and stops?: No Trouble starting stream?: No Do you have to strain to urinate?: No Blood in urine?: No Urinary tract infection?: No Sexually transmitted disease?: No Injury to kidneys or bladder?: No Painful intercourse?: No Weak stream?: No Erection problems?: No Penile pain?: No  Gastrointestinal Nausea?: No Vomiting?: No Indigestion/heartburn?: No Diarrhea?: No Constipation?: No  Constitutional Fever: No Night sweats?: No Weight loss?: No Fatigue?: No  Skin Skin rash/lesions?: No Itching?: No  Eyes Blurred vision?: No Double vision?: No  Ears/Nose/Throat Sore  throat?: No Sinus problems?: No  Hematologic/Lymphatic Swollen glands?: No Easy bruising?: No  Cardiovascular Leg swelling?: No Chest pain?: No  Respiratory Cough?: No Shortness of breath?: No  Endocrine Excessive thirst?: No  Musculoskeletal Back pain?: No Joint pain?: No  Neurological Headaches?: No Dizziness?: No  Psychologic Depression?: No Anxiety?: No  Physical Exam: BP (!) 147/76   Pulse 70   Ht 5\' 5"  (1.651 m)   Wt 187 lb (84.8 kg)   BMI 31.12 kg/m   Constitutional:  Alert and oriented, No acute distress. HEENT: [place] AT, moist mucus membranes.  Trachea midline, no masses. Cardiovascular: No clubbing, cyanosis, or edema. Respiratory: Normal respiratory effort, no increased work of breathing. GI: Abdomen is soft, nontender, nondistended, no abdominal masses GU: No CVA tenderness.  Penis circumcised without lesions, testes descended bilaterally without masses or tenderness, cord/epididymes palpably normal.  Prostate 40 g, smooth without nodules Skin: No rashes, bruises or suspicious lesions. Lymph: No cervical or inguinal adenopathy. Neurologic: Grossly intact, no focal deficits, moving all 4 extremities. Psychiatric: Normal mood and affect.  Laboratory Data: Lab Results  Component Value Date   WBC 3.8 10/27/2016   HGB 14.6 10/27/2016   HCT 45.3 10/27/2016   MCV 79.5 (L) 10/27/2016   PLT 176 10/27/2016    Lab Results  Component Value Date   CREATININE 1.30 10/27/2016    Lab Results  Component Value Date   PSA1 0.5 10/14/2014   Urinalysis Lab Results  Component Value Date   SPECGRAV 1.010 11/20/2016   PHUR 7.0 11/20/2016   COLORU Yellow 11/20/2016   APPEARANCEUR Clear 11/20/2016   LEUKOCYTESUR Negative 11/20/2016   PROTEINUR Negative 11/20/2016   GLUCOSEU Negative 11/20/2016   KETONESU Negative 11/20/2016   RBCU Negative 11/20/2016   BILIRUBINUR Negative 11/20/2016   UUROB 0.2 11/20/2016   NITRITE Negative 11/20/2016      Assessment & Plan:    1. Elevated PSA  Although PSA is a prostate cancer screening test he was informed that cancer is not the most common cause of an elevated PSA. Other potential causes including BPH and inflammation were discussed. He was informed that the only way to adequately diagnose prostate cancer would be a transrectal ultrasound and biopsy of the prostate. The procedure was discussed including potential risks of bleeding and infection/sepsis. He was also informed that a negative biopsy does not conclusively rule out the possibility that prostate cancer may be present and that continued monitoring is required. The use of newer adjunctive blood tests including PHI and 4kScore were discussed. The use of multiparametric prostate MRI was also discussed however is not typically used for initial evaluation of an elevated PSA. Continued periodic surveillance was also discussed.  There has been a significant bump in his PSA in the last  2 years which may be secondary to inflammation.  Will initially treat with an empiric alpha blocker course and he will return in 1 month for a repeat PSA.  If his PSA remains persistently elevated he would like to proceed with prostate biopsy.  - Urinalysis, Complete - PSA; Future    Riki AltesScott C Philopateer Strine, MD  [facility] [address], [address] [place], [place] [address] [phone]

## 2016-11-30 DIAGNOSIS — E119 Type 2 diabetes mellitus without complications: Secondary | ICD-10-CM | POA: Diagnosis not present

## 2016-11-30 DIAGNOSIS — Z794 Long term (current) use of insulin: Secondary | ICD-10-CM | POA: Diagnosis not present

## 2016-11-30 DIAGNOSIS — E782 Mixed hyperlipidemia: Secondary | ICD-10-CM | POA: Diagnosis not present

## 2016-12-18 DIAGNOSIS — Z23 Encounter for immunization: Secondary | ICD-10-CM | POA: Diagnosis not present

## 2016-12-18 DIAGNOSIS — E782 Mixed hyperlipidemia: Secondary | ICD-10-CM | POA: Diagnosis not present

## 2016-12-18 DIAGNOSIS — I10 Essential (primary) hypertension: Secondary | ICD-10-CM | POA: Diagnosis not present

## 2016-12-18 DIAGNOSIS — E559 Vitamin D deficiency, unspecified: Secondary | ICD-10-CM | POA: Diagnosis not present

## 2016-12-18 DIAGNOSIS — E119 Type 2 diabetes mellitus without complications: Secondary | ICD-10-CM | POA: Diagnosis not present

## 2017-01-02 ENCOUNTER — Telehealth: Payer: Self-pay | Admitting: Urology

## 2017-01-02 DIAGNOSIS — R972 Elevated prostate specific antigen [PSA]: Secondary | ICD-10-CM

## 2017-01-02 NOTE — Telephone Encounter (Signed)
Patient was seen on 10/22 for an elevated PSA.  He was supposed to have a repeat PSA drawn in late November.  Please contact and schedule.

## 2017-01-03 ENCOUNTER — Other Ambulatory Visit: Payer: BLUE CROSS/BLUE SHIELD

## 2017-01-03 DIAGNOSIS — R972 Elevated prostate specific antigen [PSA]: Secondary | ICD-10-CM

## 2017-01-03 NOTE — Telephone Encounter (Signed)
Spoke with pt in reference to needing PSA labs. Pt voiced understanding. Pt was added to lab schedule today and orders placed.

## 2017-01-03 NOTE — Telephone Encounter (Signed)
LMOM  Pt medication was also denied as pt has not had his labs.

## 2017-01-04 LAB — PSA: Prostate Specific Ag, Serum: 5.5 ng/mL — ABNORMAL HIGH (ref 0.0–4.0)

## 2017-01-05 ENCOUNTER — Telehealth: Payer: Self-pay

## 2017-01-05 NOTE — Telephone Encounter (Signed)
Spoke with pt in reference to PSA results. Made aware recommend prostate bx. Pt voiced understanding. Bx and results appts made. Reviewed prep instructions and mailed to pt.

## 2017-01-05 NOTE — Telephone Encounter (Signed)
-----   Message from Riki AltesScott C Stoioff, MD sent at 01/04/2017  7:20 AM EST ----- PSA remains persistently elevated at 5.5.  Recommend scheduling prostate biopsy.

## 2017-01-17 ENCOUNTER — Ambulatory Visit: Payer: Self-pay | Admitting: Urology

## 2017-02-09 ENCOUNTER — Encounter: Payer: Self-pay | Admitting: Urology

## 2017-02-09 ENCOUNTER — Ambulatory Visit: Payer: BLUE CROSS/BLUE SHIELD | Admitting: Urology

## 2017-02-09 ENCOUNTER — Other Ambulatory Visit: Payer: Self-pay | Admitting: Urology

## 2017-02-09 VITALS — BP 165/90 | HR 67 | Ht 65.0 in | Wt 184.8 lb

## 2017-02-09 DIAGNOSIS — R972 Elevated prostate specific antigen [PSA]: Secondary | ICD-10-CM

## 2017-02-09 MED ORDER — LIDOCAINE HCL 2 % EX GEL
1.0000 "application " | Freq: Once | CUTANEOUS | Status: AC
  Administered 2017-02-09: 1 via URETHRAL

## 2017-02-09 MED ORDER — LEVOFLOXACIN 500 MG PO TABS
500.0000 mg | ORAL_TABLET | Freq: Once | ORAL | Status: AC
  Administered 2017-02-09: 500 mg via ORAL

## 2017-02-09 MED ORDER — GENTAMICIN SULFATE 40 MG/ML IJ SOLN
80.0000 mg | Freq: Once | INTRAMUSCULAR | Status: AC
  Administered 2017-02-09: 80 mg via INTRAMUSCULAR

## 2017-02-09 NOTE — Progress Notes (Signed)
Prostate Biopsy Procedure   Informed consent was obtained after discussing risks/benefits of the procedure.  A time out was performed to ensure correct patient identity.  Pre-Procedure: - Last PSA Level: 5.5 12/2016; baseline 0.5 Lab Results  Component Value Date   PSA 4.1 (H) 10/27/2016   - Gentamicin given prophylactically - Levaquin 500 mg administered PO -Transrectal Ultrasound performed revealing a 33 gm prostate -No significant hypoechoic or median lobe noted  Procedure: - Prostate block performed using 10 cc 1% lidocaine and biopsies taken from sextant areas, a total of 12 under ultrasound guidance.  Post-Procedure: - Patient tolerated the procedure well - He was counseled to seek immediate medical attention if experiences any severe pain, significant bleeding, or fevers - Return in one week to discuss biopsy results

## 2017-02-12 ENCOUNTER — Encounter: Payer: Self-pay | Admitting: Urology

## 2017-02-20 ENCOUNTER — Other Ambulatory Visit: Payer: Self-pay | Admitting: Urology

## 2017-02-20 LAB — PATHOLOGY REPORT

## 2017-02-21 ENCOUNTER — Ambulatory Visit: Payer: Self-pay | Admitting: Urology

## 2017-02-25 ENCOUNTER — Telehealth: Payer: Self-pay | Admitting: Urology

## 2017-03-06 NOTE — Telephone Encounter (Signed)
error 

## 2017-03-19 DIAGNOSIS — E782 Mixed hyperlipidemia: Secondary | ICD-10-CM | POA: Diagnosis not present

## 2017-03-19 DIAGNOSIS — Z794 Long term (current) use of insulin: Secondary | ICD-10-CM | POA: Diagnosis not present

## 2017-03-19 DIAGNOSIS — E119 Type 2 diabetes mellitus without complications: Secondary | ICD-10-CM | POA: Diagnosis not present

## 2017-03-23 DIAGNOSIS — I10 Essential (primary) hypertension: Secondary | ICD-10-CM | POA: Diagnosis not present

## 2017-03-23 DIAGNOSIS — E119 Type 2 diabetes mellitus without complications: Secondary | ICD-10-CM | POA: Diagnosis not present

## 2017-03-23 DIAGNOSIS — E782 Mixed hyperlipidemia: Secondary | ICD-10-CM | POA: Diagnosis not present

## 2017-03-23 DIAGNOSIS — E559 Vitamin D deficiency, unspecified: Secondary | ICD-10-CM | POA: Diagnosis not present

## 2017-06-11 DIAGNOSIS — Z794 Long term (current) use of insulin: Secondary | ICD-10-CM | POA: Diagnosis not present

## 2017-06-11 DIAGNOSIS — E119 Type 2 diabetes mellitus without complications: Secondary | ICD-10-CM | POA: Diagnosis not present

## 2017-06-11 DIAGNOSIS — E782 Mixed hyperlipidemia: Secondary | ICD-10-CM | POA: Diagnosis not present

## 2017-06-15 DIAGNOSIS — E559 Vitamin D deficiency, unspecified: Secondary | ICD-10-CM | POA: Diagnosis not present

## 2017-06-15 DIAGNOSIS — E119 Type 2 diabetes mellitus without complications: Secondary | ICD-10-CM | POA: Diagnosis not present

## 2017-06-15 DIAGNOSIS — E782 Mixed hyperlipidemia: Secondary | ICD-10-CM | POA: Diagnosis not present

## 2017-06-15 DIAGNOSIS — I10 Essential (primary) hypertension: Secondary | ICD-10-CM | POA: Diagnosis not present

## 2017-06-27 DIAGNOSIS — H524 Presbyopia: Secondary | ICD-10-CM | POA: Diagnosis not present

## 2017-06-27 DIAGNOSIS — E089 Diabetes mellitus due to underlying condition without complications: Secondary | ICD-10-CM | POA: Diagnosis not present

## 2017-06-27 DIAGNOSIS — E119 Type 2 diabetes mellitus without complications: Secondary | ICD-10-CM | POA: Diagnosis not present

## 2017-06-27 DIAGNOSIS — H04123 Dry eye syndrome of bilateral lacrimal glands: Secondary | ICD-10-CM | POA: Diagnosis not present

## 2017-08-22 ENCOUNTER — Encounter: Payer: Self-pay | Admitting: Urology

## 2017-08-22 ENCOUNTER — Ambulatory Visit: Payer: BLUE CROSS/BLUE SHIELD | Admitting: Urology

## 2017-08-22 VITALS — BP 139/78 | HR 79 | Ht 65.0 in | Wt 181.6 lb

## 2017-08-22 DIAGNOSIS — R972 Elevated prostate specific antigen [PSA]: Secondary | ICD-10-CM | POA: Diagnosis not present

## 2017-08-22 NOTE — Progress Notes (Signed)
08/22/2017 10:49 AM   Jack Cardenas Sep 03, 1965 [id number]  Referring provider: Ellyn Hack, MD [address] [place], [place] [address]  Chief Complaint  Patient presents with  . Follow-up   Urologic history: 1.  Elevated PSA -Prostate biopsy 12/2016; PSA 5.5; volume 33 g; benign pathology  HPI: 52 year old male presents for follow-up of the above problem list.  He states he is doing well and has no complaints.  He denies bothersome lower urinary tract symptoms.   PMH: Past Medical History:  Diagnosis Date  . Diabetes mellitus without complication (HCC)   . Hyperlipidemia   . Hypertension   . MVP (mitral valve prolapse)    Dx'd as teenager. No issues.  Followed by PCP.    Surgical History: Past Surgical History:  Procedure Laterality Date  . COLONOSCOPY WITH PROPOFOL N/A 12/11/2014   Procedure: COLONOSCOPY WITH PROPOFOL;  Surgeon: Midge Minium, MD;  Location: [facility];  Service: Endoscopy;  Laterality: N/A;  Diabetic - insulin  . NO PAST SURGERIES      Home Medications:  Allergies as of 08/22/2017   No Known Allergies     Medication List        Accurate as of 08/22/17 10:49 AM. Always use your most recent med list.          ACCU-CHEK FASTCLIX LANCETS Misc Use to check blood sugars 3 times a day   amLODipine 5 MG tablet Commonly known as:  NORVASC Take 1 tablet once a day for blood pressure   atorvastatin 40 MG tablet Commonly known as:  LIPITOR Take 1 tablet by mouth once a day   BD PEN NEEDLE NANO U/F 32G X 4 MM Misc Generic drug:  Insulin Pen Needle Use as directed for insulin injection once a day   ezetimibe 10 MG tablet Commonly known as:  ZETIA Take 1 tablet once a day for cholesterol   FISH OIL PO Take by mouth.   GLUCOCOM BLOOD GLUCOSE MONITOR Devi Use to test blood sugars four times a day   LANTUS SOLOSTAR 100 UNIT/ML Solostar Pen Generic drug:  Insulin Glargine Inject into the skin.   multivitamin  tablet Take 1 tablet by mouth daily.   nateglinide 120 MG tablet Commonly known as:  STARLIX Take 1 tablet by mouth daily before supper.   ACCU-CHEK AVIVA PLUS test strip Generic drug:  glucose blood Use to check blood sugars 3 times a day   ONETOUCH VERIO test strip Generic drug:  glucose blood   ramipril 10 MG capsule Commonly known as:  ALTACE   Vitamin D3 2000 units capsule Take 1 capsule once a day       Allergies: No Known Allergies  Family History: Family History  Problem Relation Age of Onset  . Renal Disease Father   . Diabetes Father   . Thyroid disease Sister   . Metabolic syndrome Sister   . Eczema Sister   . Prostate cancer Paternal Grandfather     Social History:  reports that he has never smoked. He has never used smokeless tobacco. He reports that he does not drink alcohol or use drugs.  ROS: UROLOGY Frequent Urination?: No Hard to postpone urination?: No Burning/pain with urination?: No Get up at night to urinate?: No Leakage of urine?: No Urine stream starts and stops?: No Trouble starting stream?: No Do you have to strain to urinate?: No Blood in urine?: No Urinary tract infection?: No Sexually transmitted disease?: No Injury to kidneys or  bladder?: No Painful intercourse?: No Weak stream?: No Erection problems?: No Penile pain?: No  Gastrointestinal Nausea?: No Vomiting?: No Indigestion/heartburn?: No Diarrhea?: No Constipation?: No  Constitutional Fever: No Night sweats?: No Weight loss?: No Fatigue?: No  Skin Skin rash/lesions?: No Itching?: No  Eyes Blurred vision?: No Double vision?: No  Ears/Nose/Throat Sore throat?: No Sinus problems?: No  Hematologic/Lymphatic Swollen glands?: No Easy bruising?: No  Cardiovascular Leg swelling?: No Chest pain?: No  Respiratory Cough?: No Shortness of breath?: No  Endocrine Excessive thirst?: No  Musculoskeletal Back pain?: No Joint pain?:  No  Neurological Headaches?: No Dizziness?: No  Psychologic Depression?: No Anxiety?: No  Physical Exam: BP 139/78 (BP Location: Left Arm, Patient Position: Sitting, Cuff Size: Normal)   Pulse 79   Ht 5\' 5"  (1.651 m)   Wt 181 lb 9.6 oz (82.4 kg)   BMI 30.22 kg/m   Constitutional:  Alert and oriented, No acute distress. HEENT: [place] AT, moist mucus membranes.  Trachea midline, no masses. Cardiovascular: No clubbing, cyanosis, or edema. Respiratory: Normal respiratory effort, no increased work of breathing. GI: Abdomen is soft, nontender, nondistended, no abdominal masses GU: No CVA tenderness.  Prostate 30 g, smooth without nodules. Lymph: No cervical or inguinal lymphadenopathy. Skin: No rashes, bruises or suspicious lesions. Neurologic: Grossly intact, no focal deficits, moving all 4 extremities. Psychiatric: Normal mood and affect.   Assessment & Plan:   52 year old male with history of an elevated PSA and benign prostate biopsy.  PSA was drawn today and if stable I have recommended a follow-up PSA only in 6 months and PSA/DRE 1 year.  Riki AltesScott C Aletta Edmunds, MD  [facility] [address], [address] [place], [place] [address] [phone]

## 2017-08-23 LAB — PSA: Prostate Specific Ag, Serum: 3.4 ng/mL (ref 0.0–4.0)

## 2017-09-10 DIAGNOSIS — E119 Type 2 diabetes mellitus without complications: Secondary | ICD-10-CM | POA: Diagnosis not present

## 2017-09-10 DIAGNOSIS — Z794 Long term (current) use of insulin: Secondary | ICD-10-CM | POA: Diagnosis not present

## 2017-09-10 DIAGNOSIS — E782 Mixed hyperlipidemia: Secondary | ICD-10-CM | POA: Diagnosis not present

## 2017-09-10 LAB — HEMOGLOBIN A1C: HEMOGLOBIN A1C: 7

## 2017-09-10 LAB — BASIC METABOLIC PANEL
BUN: 17 (ref 4–21)
POTASSIUM: 4.1 (ref 3.4–5.3)
SODIUM: 140 (ref 137–147)

## 2017-09-10 LAB — LIPID PANEL
Cholesterol: 98 (ref 0–200)
HDL: 39 (ref 35–70)
LDL CALC: 49
TRIGLYCERIDES: 39 — AB (ref 40–160)

## 2017-09-10 LAB — HEPATIC FUNCTION PANEL
ALT: 29 (ref 10–40)
AST: 25 (ref 14–40)

## 2017-09-17 DIAGNOSIS — E559 Vitamin D deficiency, unspecified: Secondary | ICD-10-CM | POA: Diagnosis not present

## 2017-09-17 DIAGNOSIS — E782 Mixed hyperlipidemia: Secondary | ICD-10-CM | POA: Diagnosis not present

## 2017-09-17 DIAGNOSIS — E119 Type 2 diabetes mellitus without complications: Secondary | ICD-10-CM | POA: Diagnosis not present

## 2017-09-17 DIAGNOSIS — I10 Essential (primary) hypertension: Secondary | ICD-10-CM | POA: Diagnosis not present

## 2017-11-02 ENCOUNTER — Encounter: Payer: Self-pay | Admitting: Family Medicine

## 2017-11-02 ENCOUNTER — Ambulatory Visit (INDEPENDENT_AMBULATORY_CARE_PROVIDER_SITE_OTHER): Payer: BLUE CROSS/BLUE SHIELD | Admitting: Family Medicine

## 2017-11-02 VITALS — BP 140/78 | HR 85 | Temp 98.2°F | Resp 16 | Ht 65.0 in | Wt 179.1 lb

## 2017-11-02 DIAGNOSIS — E785 Hyperlipidemia, unspecified: Secondary | ICD-10-CM

## 2017-11-02 DIAGNOSIS — B353 Tinea pedis: Secondary | ICD-10-CM

## 2017-11-02 DIAGNOSIS — Z23 Encounter for immunization: Secondary | ICD-10-CM

## 2017-11-02 DIAGNOSIS — E1169 Type 2 diabetes mellitus with other specified complication: Secondary | ICD-10-CM | POA: Diagnosis not present

## 2017-11-02 DIAGNOSIS — Z0000 Encounter for general adult medical examination without abnormal findings: Secondary | ICD-10-CM

## 2017-11-02 DIAGNOSIS — Z1159 Encounter for screening for other viral diseases: Secondary | ICD-10-CM

## 2017-11-02 DIAGNOSIS — M5410 Radiculopathy, site unspecified: Secondary | ICD-10-CM

## 2017-11-02 MED ORDER — TERBINAFINE HCL 1 % EX CREA: 1.0000 "application " | TOPICAL_CREAM | Freq: Two times a day (BID) | CUTANEOUS | 0 refills | Status: DC

## 2017-11-02 NOTE — Patient Instructions (Signed)

## 2017-11-02 NOTE — Progress Notes (Signed)
Name: Jack Cardenas   MRN: [id number]    DOB: 05-16-1965   Date:11/02/2017       Progress Note  Subjective  Chief Complaint  Chief Complaint  Patient presents with  . Annual Exam    HPI  Patient presents for annual CPE .  Tinea pedis: he has DM , sees Endocrinologist and reviewed labs, he states noticed irritation on left toe web, not using any medication.  Low back pain with radiculitis: he goes to the gym on regular basis and noticed pain and popping on right lower back, he states it shoots on the front lower leg. He has been stretching more often. He states squatting makes it worse with weight. No bowel or bladder incontinence   Depression:  Depression screen [facility] 2/9 11/02/2017 10/27/2016 10/27/2015 10/14/2014  Decreased Interest 0 0 0 0  Down, Depressed, Hopeless 0 0 0 0  PHQ - 2 Score 0 0 0 0    Hypertension:  BP Readings from Last 3 Encounters:  11/02/17 130/90  08/22/17 139/78  02/09/17 (!) 165/90    Obesity: Wt Readings from Last 3 Encounters:  11/02/17 179 lb 1.6 oz (81.2 kg)  08/22/17 181 lb 9.6 oz (82.4 kg)  02/09/17 184 lb 12.8 oz (83.8 kg)   BMI Readings from Last 3 Encounters:  11/02/17 29.80 kg/m  08/22/17 30.22 kg/m  02/09/17 30.75 kg/m     Lipids:  Lab Results  Component Value Date   CHOL 98 09/10/2017   Lab Results  Component Value Date   HDL 39 09/10/2017   Lab Results  Component Value Date   LDLCALC 49 09/10/2017   Lab Results  Component Value Date   TRIG 39 (A) 09/10/2017   No results found for: CHOLHDL No results found for: LDLDIRECT Glucose:  Glucose  Date Value Ref Range Status  10/14/2014 100 (H) 65 - 99 mg/dL Final   Glucose, Bld  Date Value Ref Range Status  10/27/2016 59 (L) 65 - 99 mg/dL Final    Comment:    .            Fasting reference interval .    Glucose-Capillary  Date Value Ref Range Status  12/11/2014 88 65 - 99 mg/dL Final      Office Visit from 11/02/2017 in [facility]  AUDIT-C  Score  0      Married STD testing and prevention (HIV/chl/gon/syphilis): N/A Hep C: today   Skin cancer: discussed atypical lesions  Colorectal cancer: up to date Prostate cancer: seeing Dr Lonna Cobb last one was down to 3.4   IPSS Questionnaire (AUA-7): Over the past month.   1)  How often have you had a sensation of not emptying your bladder completely after you finish urinating?  0 - Not at all  2)  How often have you had to urinate again less than two hours after you finished urinating? 2- because he drinks water   3)  How often have you found you stopped and started again several times when you urinated?  0 - Not at all  4) How difficult have you found it to postpone urination?  0 - Not at all  5) How often have you had a weak urinary stream?  0 - Not at all  6) How often have you had to push or strain to begin urination?  0 - Not at all  7) How many times did you most typically get up to urinate from the time you went to  bed until the time you got up in the morning?  1 - 1 time  Total score:  0-7 mildly symptomatic   8-19 moderately symptomatic   20-35 severely symptomatic   Lung cancer:  Low Dose CT Chest recommended if Age 64-80 years, 30 pack-year currently smoking OR have quit w/in 15years. Patient does not qualify.   AAA:  The USPSTF recommends one-time screening with ultrasonography in men ages 65 to 56 years who have ever smoked ECG:  Up to date  Advanced Care Planning: A voluntary discussion about advance care planning including the explanation and discussion of advance directives.  Discussed health care proxy and Living will, and the patient was able to identify a health care proxy as Elenora Fender - his wife.  Patient does not know have a living will at present time.   Patient Active Problem List   Diagnosis Date Noted  . Type 2 diabetes mellitus without complication, with long-term current use of insulin (HCC) 03/03/2016  . Mixed dyslipidemia 03/03/2016  . Hypertension,  essential 03/03/2016  . Vitamin D deficiency 03/03/2016  . Special screening for malignant neoplasms, colon     Past Surgical History:  Procedure Laterality Date  . COLONOSCOPY WITH PROPOFOL N/A 12/11/2014   Procedure: COLONOSCOPY WITH PROPOFOL;  Surgeon: Midge Minium, MD;  [facility];  Service: Endoscopy;  Laterality: N/A;  Diabetic - insulin  . NO PAST SURGERIES      Family History  Problem Relation Age of Onset  . Renal Disease Father   . Diabetes Father   . Thyroid disease Sister   . Metabolic syndrome Sister   . Eczema Sister   . Prostate cancer Paternal Grandfather     Social History   Socioeconomic History  . Marital status: Married    Spouse name: Deidra   . Number of children: 2  . Years of education: Not on file  . Highest education level: Bachelor's degree (e.g., BA, AB, BS)  Occupational History  . Occupation: Teacher, adult education   Social Needs  . Financial resource strain: Not very hard  . Food insecurity:    Worry: Never true    Inability: Never true  . Transportation needs:    Medical: No    Non-medical: No  Tobacco Use  . Smoking status: Never Smoker  . Smokeless tobacco: Never Used  Substance and Sexual Activity  . Alcohol use: No    Alcohol/week: 0.0 standard drinks  . Drug use: No  . Sexual activity: Yes    Partners: Female  Lifestyle  . Physical activity:    Days per week: 4 days    Minutes per session: 80 min  . Stress: Not at all  Relationships  . Social connections:    Talks on phone: More than three times a week    Gets together: More than three times a week    Attends religious service: More than 4 times per year    Active member of club or organization: Yes    Attends meetings of clubs or organizations: More than 4 times per year    Relationship status: Married  . Intimate partner violence:    Fear of current or ex partner: No    Emotionally abused: No    Physically abused: No    Forced sexual activity: No  Other  Topics Concern  . Not on file  Social History Narrative  . Not on file     Current Outpatient Medications:  .  ACCU-CHEK FASTCLIX LANCETS MISC, Use  to check blood sugars 3 times a day, Disp: , Rfl:  .  amLODipine (NORVASC) 5 MG tablet, Take 1 tablet once a day for blood pressure, Disp: , Rfl:  .  atorvastatin (LIPITOR) 40 MG tablet, Take 1 tablet by mouth once a day, Disp: , Rfl:  .  Blood Glucose Monitoring Suppl (GLUCOCOM BLOOD GLUCOSE MONITOR) DEVI, Use to test blood sugars four times a day, Disp: , Rfl:  .  Cholecalciferol (VITAMIN D3) 2000 units capsule, Take 1 capsule once a day, Disp: , Rfl:  .  ezetimibe (ZETIA) 10 MG tablet, Take 1 tablet once a day for cholesterol, Disp: , Rfl:  .  glucose blood (ACCU-CHEK AVIVA PLUS) test strip, Use to check blood sugars 3 times a day, Disp: , Rfl:  .  Insulin Glargine (LANTUS SOLOSTAR) 100 UNIT/ML Solostar Pen, Inject into the skin., Disp: , Rfl:  .  Insulin Pen Needle (BD PEN NEEDLE NANO U/F) 32G X 4 MM MISC, Use as directed for insulin injection once a day, Disp: , Rfl:  .  Multiple Vitamin (MULTIVITAMIN) tablet, Take 1 tablet by mouth daily., Disp: , Rfl:  .  nateglinide (STARLIX) 120 MG tablet, Take 1 tablet by mouth daily before supper., Disp: , Rfl:  .  Omega-3 Fatty Acids (FISH OIL PO), Take by mouth., Disp: , Rfl:  .  ONETOUCH VERIO test strip, , Disp: , Rfl:  .  ramipril (ALTACE) 10 MG capsule, , Disp: , Rfl:  .  aspirin EC 81 MG tablet, Take 1 tablet by mouth daily., Disp: , Rfl:  .  terbinafine (LAMISIL AT) 1 % cream, Apply 1 application topically 2 (two) times daily., Disp: 30 g, Rfl: 0  No Known Allergies   ROS  Constitutional: Negative for fever or weight change.  Respiratory: Negative for cough and shortness of breath.   Cardiovascular: Negative for chest pain or palpitations.  Gastrointestinal: Negative for abdominal pain, no bowel changes.  Musculoskeletal: Negative for gait problem or joint swelling.  Skin: Negative  for rash.  Neurological: Negative for dizziness or headache.  No other specific complaints in a complete review of systems (except as listed in HPI above).  Objective  Vitals:   11/02/17 0917  BP: 130/90  Pulse: 85  Resp: 16  Temp: 98.2 F (36.8 C)  TempSrc: Oral  SpO2: 99%  Weight: 179 lb 1.6 oz (81.2 kg)  Height: 5\' 5"  (1.651 m)    Body mass index is 29.8 kg/m.  Physical Exam  Constitutional: Patient appears well-developed and muscular . No distress.  HENT: Head: Normocephalic and atraumatic. Ears: B TMs ok, no erythema or effusion; Nose: Nose normal. Mouth/Throat: Oropharynx is clear and moist. No oropharyngeal exudate.  Eyes: Conjunctivae and EOM are normal. Pupils are equal, round, and reactive to light. No scleral icterus.  Neck: Normal range of motion. Neck supple. No JVD present. No thyromegaly present.  Cardiovascular: Normal rate, regular rhythm and normal heart sounds.  No murmur heard. No BLE edema. Pulmonary/Chest: Effort normal and breath sounds normal. No respiratory distress. Abdominal: Soft. Bowel sounds are normal, no distension. There is no tenderness. no masses MALE GENITALIA: not done RECTAL: not done Musculoskeletal: Normal range of motion, no joint effusions. No gross deformities. Nodule palpable on right lower back, negative straight leg raise, cyst on ventral left wrist Neurological: he is alert and oriented to person, place, and time. No cranial nerve deficit. Coordination, balance, strength, speech and gait are normal.  Skin: Skin is warm and dry. Maceration  on 5th toe web Psychiatric: Patient has a normal mood and affect. behavior is normal. Judgment and thought content normal.  Recent Results (from the past 2160 hour(s))  PSA     Status: None   Collection Time: 08/22/17 10:50 AM  Result Value Ref Range   Prostate Specific Ag, Serum 3.4 0.0 - 4.0 ng/mL    Comment: Roche ECLIA methodology. According to the American Urological Association, Serum  PSA should decrease and remain at undetectable levels after radical prostatectomy. The AUA defines biochemical recurrence as an initial PSA value 0.2 ng/mL or greater followed by a subsequent confirmatory PSA value 0.2 ng/mL or greater. Values obtained with different assay methods or kits cannot be used interchangeably. Results cannot be interpreted as absolute evidence of the presence or absence of malignant disease.   Basic metabolic panel     Status: None   Collection Time: 09/10/17 12:00 AM  Result Value Ref Range   BUN 17 4 - 21   Potassium 4.1 3.4 - 5.3   Sodium 140 137 - 147  Lipid panel     Status: None   Collection Time: 09/10/17 12:00 AM  Result Value Ref Range   Cholesterol 98 0 - 200   HDL 39 35 - 70   LDL Cholesterol 49   Hepatic function panel     Status: None   Collection Time: 09/10/17 12:00 AM  Result Value Ref Range   ALT 29 10 - 40   AST 25 14 - 40  Hemoglobin A1c     Status: None   Collection Time: 09/10/17 12:00 AM  Result Value Ref Range   Hemoglobin A1C 7.0   Lipid panel     Status: Abnormal   Collection Time: 09/10/17 12:00 AM  Result Value Ref Range   Triglycerides 39 (A) 40 - 160     PHQ2/9: Depression screen [facility] 2/9 11/02/2017 10/27/2016 10/27/2015 10/14/2014  Decreased Interest 0 0 0 0  Down, Depressed, Hopeless 0 0 0 0  PHQ - 2 Score 0 0 0 0     Fall Risk: Fall Risk  11/02/2017 10/27/2016 10/27/2015 10/14/2014  Falls in the past year? No No No No     Functional Status Survey: Is the patient deaf or have difficulty hearing?: No Does the patient have difficulty seeing, even when wearing glasses/contacts?: No Does the patient have difficulty concentrating, remembering, or making decisions?: No Does the patient have difficulty walking or climbing stairs?: No Does the patient have difficulty dressing or bathing?: No Does the patient have difficulty doing errands alone such as visiting a doctor's office or shopping?: No    Assessment &  Plan  1. Annual physical exam   2. Need for 23-polyvalent pneumococcal polysaccharide vaccine  - Pneumococcal polysaccharide vaccine 23-valent greater than or equal to 2yo subcutaneous/IM  3. Dyslipidemia associated with type 2 diabetes mellitus (HCC)  We will get copy of eye exam - [facility]   4. Needs flu shot  - Flu Vaccine QUAD 6+ mos PF IM (Fluarix Quad PF)  5. Need for hepatitis C screening test  - Hepatitis C Antibody  6. Tinea pedis of left foot  - terbinafine (LAMISIL AT) 1 % cream; Apply 1 application topically 2 (two) times daily.  Dispense: 30 g; Refill: 0   7. Acute low back pain with radicular symptoms, duration less than 6 weeks  He ask for chiropractor.   -Prostate cancer screening and PSA options (with potential risks and benefits of testing vs not  testing) were discussed along with recent recs/guidelines. -USPSTF grade A and B recommendations reviewed with patient; age-appropriate recommendations, preventive care, screening tests, etc discussed and encouraged; healthy living encouraged; see AVS for patient education given to patient -Discussed importance of 150 minutes of physical activity weekly, eat two servings of fish weekly, eat one serving of tree nuts ( cashews, pistachios, pecans, almonds.Marland Kitchen) every other day, eat 6 servings of fruit/vegetables daily and drink plenty of water and avoid sweet beverages.

## 2017-11-03 LAB — HEPATITIS C ANTIBODY
HEP C AB: NONREACTIVE
SIGNAL TO CUT-OFF: 0.05 (ref <1.00)

## 2017-12-14 DIAGNOSIS — E119 Type 2 diabetes mellitus without complications: Secondary | ICD-10-CM | POA: Diagnosis not present

## 2017-12-14 DIAGNOSIS — Z794 Long term (current) use of insulin: Secondary | ICD-10-CM | POA: Diagnosis not present

## 2017-12-14 DIAGNOSIS — E782 Mixed hyperlipidemia: Secondary | ICD-10-CM | POA: Diagnosis not present

## 2017-12-17 DIAGNOSIS — I10 Essential (primary) hypertension: Secondary | ICD-10-CM | POA: Diagnosis not present

## 2017-12-17 DIAGNOSIS — E782 Mixed hyperlipidemia: Secondary | ICD-10-CM | POA: Diagnosis not present

## 2017-12-17 DIAGNOSIS — E119 Type 2 diabetes mellitus without complications: Secondary | ICD-10-CM | POA: Diagnosis not present

## 2017-12-17 DIAGNOSIS — E559 Vitamin D deficiency, unspecified: Secondary | ICD-10-CM | POA: Diagnosis not present

## 2018-02-06 ENCOUNTER — Ambulatory Visit (HOSPITAL_COMMUNITY)
Admission: EM | Admit: 2018-02-06 | Discharge: 2018-02-06 | Discharge status: Against Medical Advice | Payer: BLUE CROSS/BLUE SHIELD

## 2018-02-06 NOTE — ED Notes (Signed)
Per pt access, pt lwbs 

## 2018-02-22 ENCOUNTER — Other Ambulatory Visit: Payer: BLUE CROSS/BLUE SHIELD

## 2018-02-22 DIAGNOSIS — R972 Elevated prostate specific antigen [PSA]: Secondary | ICD-10-CM | POA: Diagnosis not present

## 2018-02-23 LAB — PSA: Prostate Specific Ag, Serum: 2.2 ng/mL (ref 0.0–4.0)

## 2018-03-15 DIAGNOSIS — E119 Type 2 diabetes mellitus without complications: Secondary | ICD-10-CM | POA: Diagnosis not present

## 2018-03-15 DIAGNOSIS — E782 Mixed hyperlipidemia: Secondary | ICD-10-CM | POA: Diagnosis not present

## 2018-03-15 DIAGNOSIS — Z794 Long term (current) use of insulin: Secondary | ICD-10-CM | POA: Diagnosis not present

## 2018-03-18 DIAGNOSIS — I10 Essential (primary) hypertension: Secondary | ICD-10-CM | POA: Diagnosis not present

## 2018-03-18 DIAGNOSIS — E119 Type 2 diabetes mellitus without complications: Secondary | ICD-10-CM | POA: Diagnosis not present

## 2018-03-18 DIAGNOSIS — E782 Mixed hyperlipidemia: Secondary | ICD-10-CM | POA: Diagnosis not present

## 2018-03-18 DIAGNOSIS — E559 Vitamin D deficiency, unspecified: Secondary | ICD-10-CM | POA: Diagnosis not present

## 2018-06-12 DIAGNOSIS — E119 Type 2 diabetes mellitus without complications: Secondary | ICD-10-CM | POA: Diagnosis not present

## 2018-06-12 DIAGNOSIS — E782 Mixed hyperlipidemia: Secondary | ICD-10-CM | POA: Diagnosis not present

## 2018-06-12 DIAGNOSIS — Z794 Long term (current) use of insulin: Secondary | ICD-10-CM | POA: Diagnosis not present

## 2018-06-17 DIAGNOSIS — Z794 Long term (current) use of insulin: Secondary | ICD-10-CM | POA: Diagnosis not present

## 2018-06-17 DIAGNOSIS — E119 Type 2 diabetes mellitus without complications: Secondary | ICD-10-CM | POA: Diagnosis not present

## 2018-06-17 DIAGNOSIS — E782 Mixed hyperlipidemia: Secondary | ICD-10-CM | POA: Diagnosis not present

## 2018-06-17 DIAGNOSIS — I10 Essential (primary) hypertension: Secondary | ICD-10-CM | POA: Diagnosis not present

## 2018-08-21 ENCOUNTER — Ambulatory Visit: Payer: BLUE CROSS/BLUE SHIELD | Admitting: Urology

## 2018-09-17 DIAGNOSIS — Z794 Long term (current) use of insulin: Secondary | ICD-10-CM | POA: Diagnosis not present

## 2018-09-17 DIAGNOSIS — E782 Mixed hyperlipidemia: Secondary | ICD-10-CM | POA: Diagnosis not present

## 2018-09-17 DIAGNOSIS — E119 Type 2 diabetes mellitus without complications: Secondary | ICD-10-CM | POA: Diagnosis not present

## 2018-09-17 LAB — HEPATIC FUNCTION PANEL
ALT: 31 (ref 10–40)
AST: 23 (ref 14–40)
Alkaline Phosphatase: 59 (ref 25–125)

## 2018-09-17 LAB — BASIC METABOLIC PANEL
BUN: 17 (ref 4–21)
Creatinine: 1.2 (ref 0.6–1.3)
Glucose: 90
Glucose: 96
Potassium: 3.5 (ref 3.4–5.3)
Sodium: 140 (ref 137–147)

## 2018-09-17 LAB — HEMOGLOBIN A1C: Hemoglobin A1C: 7.6

## 2018-09-17 LAB — LIPID PANEL
Cholesterol: 116 (ref 0–200)
HDL: 40 (ref 35–70)
LDL Cholesterol: 62
Triglycerides: 51 (ref 40–160)

## 2018-09-19 ENCOUNTER — Other Ambulatory Visit: Payer: Self-pay

## 2018-09-19 DIAGNOSIS — R972 Elevated prostate specific antigen [PSA]: Secondary | ICD-10-CM

## 2018-09-20 ENCOUNTER — Other Ambulatory Visit: Payer: Self-pay

## 2018-09-20 ENCOUNTER — Other Ambulatory Visit: Payer: BC Managed Care – PPO

## 2018-09-20 DIAGNOSIS — R972 Elevated prostate specific antigen [PSA]: Secondary | ICD-10-CM

## 2018-09-21 LAB — PSA: Prostate Specific Ag, Serum: 2.7 ng/mL (ref 0.0–4.0)

## 2018-09-24 ENCOUNTER — Encounter: Payer: Self-pay | Admitting: Urology

## 2018-09-24 ENCOUNTER — Other Ambulatory Visit: Payer: Self-pay

## 2018-09-24 ENCOUNTER — Ambulatory Visit: Payer: BC Managed Care – PPO | Admitting: Urology

## 2018-09-24 VITALS — BP 155/60 | HR 77 | Ht 65.0 in | Wt 179.0 lb

## 2018-09-24 DIAGNOSIS — Z87898 Personal history of other specified conditions: Secondary | ICD-10-CM

## 2018-09-24 DIAGNOSIS — E782 Mixed hyperlipidemia: Secondary | ICD-10-CM | POA: Diagnosis not present

## 2018-09-24 DIAGNOSIS — Z794 Long term (current) use of insulin: Secondary | ICD-10-CM | POA: Diagnosis not present

## 2018-09-24 DIAGNOSIS — E119 Type 2 diabetes mellitus without complications: Secondary | ICD-10-CM | POA: Diagnosis not present

## 2018-09-24 DIAGNOSIS — I10 Essential (primary) hypertension: Secondary | ICD-10-CM | POA: Diagnosis not present

## 2018-09-24 NOTE — Progress Notes (Signed)
09/24/2018 1:31 PM   Jack Cardenas 1965-12-22 [id number]  Referring provider: Alba CorySowles, Krichna, MD [address] [place] [id number]  Chief Complaint  Patient presents with  . Elevated PSA    Urologic history: 1.  Elevated PSA -Prostate biopsy 12/2016; PSA 5.5; volume 33 g; benign pathology  HPI: 53 y.o. male presents for annual follow-up of an elevated PSA.  Since his visit last year he states he has been doing well.  He has no bothersome lower urinary tract symptoms.  Denies dysuria or gross hematuria.  PSA drawn 09/20/2018 was stable at 2.7.   PMH: Past Medical History:  Diagnosis Date  . Diabetes mellitus without complication (HCC)   . Hyperlipidemia   . Hypertension   . MVP (mitral valve prolapse)    Dx'd as teenager. No issues.  Followed by PCP.    Surgical History: Past Surgical History:  Procedure Laterality Date  . COLONOSCOPY WITH PROPOFOL N/A 12/11/2014   Procedure: COLONOSCOPY WITH PROPOFOL;  Surgeon: Midge Miniumarren Wohl, MD;  [facility];  Service: Endoscopy;  Laterality: N/A;  Diabetic - insulin  . NO PAST SURGERIES      Home Medications:  Allergies as of 09/24/2018   No Known Allergies     Medication List       Accurate as of September 24, 2018  1:31 PM. If you have any questions, ask your nurse or doctor.        Accu-Chek FastClix Lancets Misc Use to check blood sugars 3 times a day   amLODipine 5 MG tablet Commonly known as: NORVASC Take 1 tablet once a day for blood pressure   aspirin EC 81 MG tablet Take 1 tablet by mouth daily.   atorvastatin 40 MG tablet Commonly known as: LIPITOR Take 1 tablet by mouth once a day   BD Pen Needle Nano U/F 32G X 4 MM Misc Generic drug: Insulin Pen Needle Use as directed for insulin injection once a day   ezetimibe 10 MG tablet Commonly known as: ZETIA Take 1 tablet once a day for cholesterol   FISH OIL PO Take by mouth.   GlucoCom Blood Glucose Monitor Devi Use  to test blood sugars four times a day   Lantus SoloStar 100 UNIT/ML Solostar Pen Generic drug: Insulin Glargine Inject into the skin.   multivitamin tablet Take 1 tablet by mouth daily.   nateglinide 120 MG tablet Commonly known as: STARLIX Take 1 tablet by mouth daily before supper.   Accu-Chek Aviva Plus test strip Generic drug: glucose blood Use to check blood sugars 3 times a day   OneTouch Verio test strip Generic drug: glucose blood   ramipril 10 MG capsule Commonly known as: ALTACE   terbinafine 1 % cream Commonly known as: LamISIL AT Apply 1 application topically 2 (two) times daily.   Vitamin D3 50 MCG (2000 UT) capsule Take 1 capsule once a day       Allergies: No Known Allergies  Family History: Family History  Problem Relation Age of Onset  . Renal Disease Father   . Diabetes Father   . Thyroid disease Sister   . Metabolic syndrome Sister   . Eczema Sister   . Prostate cancer Paternal Grandfather     Social History:  reports that he has never smoked. He has never used smokeless tobacco. He reports that he does not drink alcohol or use drugs.  ROS: UROLOGY Frequent Urination?: No Hard to postpone urination?: No Burning/pain with  urination?: No Get up at night to urinate?: No Leakage of urine?: No Urine stream starts and stops?: No Trouble starting stream?: No Do you have to strain to urinate?: No Blood in urine?: No Urinary tract infection?: No Sexually transmitted disease?: No Injury to kidneys or bladder?: No Painful intercourse?: No Weak stream?: No Erection problems?: No Penile pain?: No  Gastrointestinal Nausea?: No Vomiting?: No Indigestion/heartburn?: No Diarrhea?: No Constipation?: No  Constitutional Fever: No Night sweats?: No Weight loss?: No Fatigue?: No  Skin Skin rash/lesions?: No Itching?: No  Eyes Blurred vision?: No Double vision?: No  Ears/Nose/Throat Sore throat?: No Sinus problems?: No   Hematologic/Lymphatic Swollen glands?: No Easy bruising?: No  Cardiovascular Leg swelling?: No Chest pain?: No  Respiratory Cough?: No Shortness of breath?: No  Endocrine Excessive thirst?: No  Musculoskeletal Back pain?: No Joint pain?: No  Neurological Headaches?: No Dizziness?: No  Psychologic Depression?: No Anxiety?: No  Physical Exam: BP (!) 155/60   Pulse 77   Ht 5\' 5"  (1.651 m)   Wt 179 lb (81.2 kg)   BMI 29.79 kg/m   Constitutional:  Alert and oriented, No acute distress. HEENT: [place] AT, moist mucus membranes.  Trachea midline, no masses. Cardiovascular: No clubbing, cyanosis, or edema. Respiratory: Normal respiratory effort, no increased work of breathing. GU: Prostate 30 g, smooth without nodules   Assessment & Plan:    - History elevated PSA Benign biopsy for a PSA of 5.5.  Subsequent PSAs have been within the normal range.  DRE is benign.  Recommend continuing annual PSA/DRE.   Abbie Sons, [place] [address], [place], [place] [address] [phone]

## 2018-10-09 DIAGNOSIS — H04123 Dry eye syndrome of bilateral lacrimal glands: Secondary | ICD-10-CM | POA: Diagnosis not present

## 2018-10-09 DIAGNOSIS — E119 Type 2 diabetes mellitus without complications: Secondary | ICD-10-CM | POA: Diagnosis not present

## 2018-10-09 DIAGNOSIS — H524 Presbyopia: Secondary | ICD-10-CM | POA: Diagnosis not present

## 2018-10-09 DIAGNOSIS — E089 Diabetes mellitus due to underlying condition without complications: Secondary | ICD-10-CM | POA: Diagnosis not present

## 2018-11-04 ENCOUNTER — Encounter: Payer: Self-pay | Admitting: Family Medicine

## 2018-11-04 ENCOUNTER — Ambulatory Visit (INDEPENDENT_AMBULATORY_CARE_PROVIDER_SITE_OTHER): Payer: BC Managed Care – PPO | Admitting: Family Medicine

## 2018-11-04 ENCOUNTER — Other Ambulatory Visit: Payer: Self-pay

## 2018-11-04 VITALS — BP 122/64 | HR 77 | Temp 97.7°F | Resp 16 | Ht 65.0 in | Wt 183.0 lb

## 2018-11-04 DIAGNOSIS — E559 Vitamin D deficiency, unspecified: Secondary | ICD-10-CM

## 2018-11-04 DIAGNOSIS — E538 Deficiency of other specified B group vitamins: Secondary | ICD-10-CM

## 2018-11-04 DIAGNOSIS — Z23 Encounter for immunization: Secondary | ICD-10-CM

## 2018-11-04 DIAGNOSIS — Z0000 Encounter for general adult medical examination without abnormal findings: Secondary | ICD-10-CM | POA: Diagnosis not present

## 2018-11-04 NOTE — Progress Notes (Signed)
Name: Jack Cardenas   MRN: [id number]    DOB: 1965-08-13   Date:11/04/2018       Progress Note  Subjective  Chief Complaint  Chief Complaint  Patient presents with  . Annual Exam    HPI  Patient presents for annual CPE.  Diet: high protein, lots of fiber, and low sugar  Exercise: he just resumed going back the gym.  USPSTF grade A and B recommendations    Office Visit from 11/04/2018 in [facility]  AUDIT-C Score  0     Depression: Phq 9 is  negative Depression screen [facility] 2/9 11/04/2018 11/02/2017 10/27/2016 10/27/2015 10/14/2014  Decreased Interest 0 0 0 0 0  Down, Depressed, Hopeless 0 0 0 0 0  PHQ - 2 Score 0 0 0 0 0  Altered sleeping 0 - - - -  Tired, decreased energy 0 - - - -  Change in appetite 0 - - - -  Feeling bad or failure about yourself  0 - - - -  Trouble concentrating 0 - - - -  Moving slowly or fidgety/restless 0 - - - -  Suicidal thoughts 0 - - - -  PHQ-9 Score 0 - - - -  Difficult doing work/chores Not difficult at all - - - -   Hypertension: BP Readings from Last 3 Encounters:  11/04/18 122/64  09/24/18 (!) 155/60  11/02/17 140/78   Obesity: Wt Readings from Last 3 Encounters:  11/04/18 183 lb (83 kg)  09/24/18 179 lb (81.2 kg)  11/02/17 179 lb 1.6 oz (81.2 kg)   BMI Readings from Last 3 Encounters:  11/04/18 30.45 kg/m  09/24/18 29.79 kg/m  11/02/17 29.80 kg/m     IPSS Questionnaire (AUA-7): Over the past month.   1)  How often have you had a sensation of not emptying your bladder completely after you finish urinating?  0 - Not at all  2)  How often have you had to urinate again less than two hours after you finished urinating? 0 - Not at all  3)  How often have you found you stopped and started again several times when you urinated?  0 - Not at all  4) How difficult have you found it to postpone urination?  0 - Not at all  5) How often have you had a weak urinary stream?  0 - Not at all  6) How often have you had to  push or strain to begin urination?  0 - Not at all  7) How many times did you most typically get up to urinate from the time you went to bed until the time you got up in the morning?  1 - 1 time  Total score:  0-7 mildly symptomatic   8-19 moderately symptomatic   20-35 severely symptomatic    Skin cancer: discussed atypical lesions  Colorectal cancer: repeat in 2026 Lung cancer:  Low Dose CT Chest recommended if Age 70-80 years, 30 pack-year currently smoking OR have quit w/in 15years. Patient does not qualify.   [id number]   Advanced Care Planning: A voluntary discussion about advance care planning including the explanation and discussion of advance directives.  Discussed health care proxy and Living will, and the patient was able to identify a health care proxy as his wife .  Patient does not have a living will at present time.   Lipids: Lab Results  Component Value Date   CHOL 116 09/17/2018   CHOL 98 09/10/2017  Lab Results  Component Value Date   HDL 40 09/17/2018   HDL 39 09/10/2017   Lab Results  Component Value Date   LDLCALC 62 09/17/2018   LDLCALC 49 09/10/2017   Lab Results  Component Value Date   TRIG 51 09/17/2018   TRIG 39 (A) 09/10/2017   No results found for: CHOLHDL No results found for: LDLDIRECT  Glucose: Glucose  Date Value Ref Range Status  10/14/2014 100 (H) 65 - 99 mg/dL Final   Glucose, Bld  Date Value Ref Range Status  10/27/2016 59 (L) 65 - 99 mg/dL Final    Comment:    .            Fasting reference interval .    Glucose-Capillary  Date Value Ref Range Status  12/11/2014 88 65 - 99 mg/dL Final    Patient Active Problem List   Diagnosis Date Noted  . History of elevated PSA 09/24/2018  . Type 2 diabetes mellitus without complication, with long-term current use of insulin (HCC) 03/03/2016  . Mixed dyslipidemia 03/03/2016  . Hypertension, essential 03/03/2016  . Vitamin D deficiency 03/03/2016  . Special screening for malignant  neoplasms, colon     Past Surgical History:  Procedure Laterality Date  . COLONOSCOPY WITH PROPOFOL N/A 12/11/2014   Procedure: COLONOSCOPY WITH PROPOFOL;  Surgeon: Midge Minium, MD;  Location: [facility];  Service: Endoscopy;  Laterality: N/A;  Diabetic - insulin  . NO PAST SURGERIES      Family History  Problem Relation Age of Onset  . Renal Disease Father   . Diabetes Father   . Thyroid disease Sister   . Metabolic syndrome Sister   . Eczema Sister   . Prostate cancer Paternal Grandfather   . Colon cancer Paternal Grandfather     Social History   Socioeconomic History  . Marital status: Married    Spouse name: Deidra   . Number of children: 2  . Years of education: Not on file  . Highest education level: Bachelor's degree (e.g., BA, AB, BS)  Occupational History  . Occupation: Teacher, adult education     Comment: Recruitment consultant   Social Needs  . Financial resource strain: Not very hard  . Food insecurity    Worry: Never true    Inability: Never true  . Transportation needs    Medical: No    Non-medical: No  Tobacco Use  . Smoking status: Never Smoker  . Smokeless tobacco: Never Used  Substance and Sexual Activity  . Alcohol use: No    Alcohol/week: 0.0 standard drinks  . Drug use: No  . Sexual activity: Yes    Partners: Female  Lifestyle  . Physical activity    Days per week: 4 days    Minutes per session: 80 min  . Stress: Not at all  Relationships  . Social connections    Talks on phone: More than three times a week    Gets together: More than three times a week    Attends religious service: More than 4 times per year    Active member of club or organization: Yes    Attends meetings of clubs or organizations: More than 4 times per year    Relationship status: Married  . Intimate partner violence    Fear of current or ex partner: No    Emotionally abused: No    Physically abused: No    Forced sexual activity: No  Other Topics Concern  . Not on  file  Social History Narrative  . Not on file     Current Outpatient Medications:  .  ACCU-CHEK FASTCLIX LANCETS MISC, Use to check blood sugars 3 times a day, Disp: , Rfl:  .  amLODipine (NORVASC) 5 MG tablet, Take 1 tablet once a day for blood pressure, Disp: , Rfl:  .  aspirin EC 81 MG tablet, Take 1 tablet by mouth daily., Disp: , Rfl:  .  atorvastatin (LIPITOR) 40 MG tablet, Take 1 tablet by mouth once a day, Disp: , Rfl:  .  Blood Glucose Monitoring Suppl (GLUCOCOM BLOOD GLUCOSE MONITOR) DEVI, Use to test blood sugars four times a day, Disp: , Rfl:  .  Cholecalciferol (VITAMIN D3) 2000 units capsule, Take 1 capsule once a day, Disp: , Rfl:  .  ezetimibe (ZETIA) 10 MG tablet, Take 1 tablet once a day for cholesterol, Disp: , Rfl:  .  glucose blood (ACCU-CHEK AVIVA PLUS) test strip, Use to check blood sugars 3 times a day, Disp: , Rfl:  .  Insulin Glargine (LANTUS SOLOSTAR) 100 UNIT/ML Solostar Pen, Inject into the skin daily. Sliding Scale, Disp: , Rfl:  .  Insulin Pen Needle (BD PEN NEEDLE NANO U/F) 32G X 4 MM MISC, Use as directed for insulin injection once a day, Disp: , Rfl:  .  Multiple Vitamin (MULTIVITAMIN) tablet, Take 1 tablet by mouth daily., Disp: , Rfl:  .  nateglinide (STARLIX) 120 MG tablet, Take 1 tablet by mouth daily before supper., Disp: , Rfl:  .  Omega-3 Fatty Acids (FISH OIL PO), Take by mouth., Disp: , Rfl:  .  ONETOUCH VERIO test strip, , Disp: , Rfl:  .  ramipril (ALTACE) 10 MG capsule, , Disp: , Rfl:  .  terbinafine (LAMISIL AT) 1 % cream, Apply 1 application topically 2 (two) times daily. (Patient not taking: Reported on 11/04/2018), Disp: 30 g, Rfl: 0  No Known Allergies   ROS  Constitutional: Negative for fever or weight change.  Respiratory: Negative for cough and shortness of breath.   Cardiovascular: Negative for chest pain or palpitations.  Gastrointestinal: Negative for abdominal pain, no bowel changes.  Musculoskeletal: Negative for gait problem  or joint swelling.  Skin: Negative for rash.  Neurological: Negative for dizziness or headache.  No other specific complaints in a complete review of systems (except as listed in HPI above).  Objective  Vitals:   11/04/18 0927  BP: 122/64  Pulse: 77  Resp: 16  Temp: 97.7 F (36.5 C)  TempSrc: Temporal  SpO2: 95%  Weight: 183 lb (83 kg)  Height: 5\' 5"  (1.651 m)    Body mass index is 30.45 kg/m.  Physical Exam  Constitutional: Patient appears well-developed and muscular . No distress.  HENT: Head: Normocephalic and atraumatic. Ears: B TMs ok, no erythema or effusion; Nose: Nose normal. Mouth/Throat: not done, wearing a mask  Eyes: Conjunctivae and EOM are normal. Pupils are equal, round, and reactive to light. No scleral icterus.  Neck: Normal range of motion. Neck supple. No JVD present. No thyromegaly present.  Cardiovascular: Normal rate, regular rhythm and normal heart sounds.  No murmur heard. No BLE edema. Pulmonary/Chest: Effort normal and breath sounds normal. No respiratory distress. Abdominal: Soft. Bowel sounds are normal, no distension. There is no tenderness. no masses MALE GENITALIA: sees Urologist  RECTAL:not done  Musculoskeletal: Normal range of motion, no joint effusions. No gross deformities Neurological: he is alert and oriented to person, place, and time. No cranial nerve deficit. Coordination, balance, strength,  speech and gait are normal.  Skin: Skin is warm and dry. No rash noted. No erythema.  Psychiatric: Patient has a normal mood and affect. behavior is normal. Judgment and thought content normal.  Recent Results (from the past 2160 hour(s))  Basic metabolic panel     Status: None   Collection Time: 09/17/18 12:00 AM  Result Value Ref Range   Glucose 96    BUN 17 4 - 21   Creatinine 1.2 0.6 - 1.3   Potassium 3.5 3.4 - 5.3   Sodium 140 137 - 147  Basic metabolic panel     Status: None   Collection Time: 09/17/18 12:00 AM  Result Value Ref  Range   Glucose 90   Lipid panel     Status: None   Collection Time: 09/17/18 12:00 AM  Result Value Ref Range   Triglycerides 51 40 - 160   Cholesterol 116 0 - 200   HDL 40 35 - 70   LDL Cholesterol 62   Hepatic function panel     Status: None   Collection Time: 09/17/18 12:00 AM  Result Value Ref Range   Alkaline Phosphatase 59 25 - 125   ALT 31 10 - 40   AST 23 14 - 40  Hemoglobin A1c     Status: None   Collection Time: 09/17/18 12:00 AM  Result Value Ref Range   Hemoglobin A1C 7.6   PSA     Status: None   Collection Time: 09/20/18  9:56 AM  Result Value Ref Range   Prostate Specific Ag, Serum 2.7 0.0 - 4.0 ng/mL    Comment: Roche ECLIA methodology. According to the American Urological Association, Serum PSA should decrease and remain at undetectable levels after radical prostatectomy. The AUA defines biochemical recurrence as an initial PSA value 0.2 ng/mL or greater followed by a subsequent confirmatory PSA value 0.2 ng/mL or greater. Values obtained with different assay methods or kits cannot be used interchangeably. Results cannot be interpreted as absolute evidence of the presence or absence of malignant disease.     Diabetic Foot Exam: Diabetic Foot Exam - Simple   Simple Foot Form Diabetic Foot exam was performed with the following findings: Yes 11/04/2018 10:07 AM  Visual Inspection No deformities, no ulcerations, no other skin breakdown bilaterally: Yes Sensation Testing Intact to touch and monofilament testing bilaterally: Yes Pulse Check Posterior Tibialis and Dorsalis pulse intact bilaterally: Yes Comments     Fall Risk: Fall Risk  11/04/2018 11/02/2017 10/27/2016 10/27/2015 10/14/2014  Falls in the past year? 0 No No No No  Number falls in past yr: 0 - - - -  Injury with Fall? 0 - - - -     Functional Status Survey: Is the patient deaf or have difficulty hearing?: No Does the patient have difficulty seeing, even when wearing glasses/contacts?:  No Does the patient have difficulty concentrating, remembering, or making decisions?: No Does the patient have difficulty walking or climbing stairs?: No Does the patient have difficulty dressing or bathing?: No Does the patient have difficulty doing errands alone such as visiting a doctor's office or shopping?: No   Assessment & Plan  1. Well adult exam  - Vitamin B12 - CBC with Differential/Platelet - VITAMIN D 25 Hydroxy (Vit-D Deficiency, Fractures)  2. Encounter for immunization  - Flu Vaccine MDCK QUAD PF  3. Vitamin D deficiency  - VITAMIN D 25 Hydroxy (Vit-D Deficiency, Fractures)  4. B12 deficiency  - Vitamin B12 - CBC with  Differential/Platelet  -USPSTF grade A and B recommendations reviewed with patient; age-appropriate recommendations, preventive care, screening tests, etc discussed and encouraged; healthy living encouraged; see AVS for patient education given to patient -Discussed importance of 150 minutes of physical activity weekly, eat two servings of fish weekly, eat one serving of tree nuts ( cashews, pistachios, pecans, almonds.Marland Kitchen) every other day, eat 6 servings of fruit/vegetables daily and drink plenty of water and avoid sweet beverages.

## 2018-11-04 NOTE — Patient Instructions (Signed)
Preventive Care 40-54 Years Old, Male Preventive care refers to lifestyle choices and visits with your health care provider that can promote health and wellness. This includes:  A yearly physical exam. This is also called an annual well check.  Regular dental and eye exams.  Immunizations.  Screening for certain conditions.  Healthy lifestyle choices, such as eating a healthy diet, getting regular exercise, not using drugs or products that contain nicotine and tobacco, and limiting alcohol use. What can I expect for my preventive care visit? Physical exam Your health care provider will check:  Height and weight. These may be used to calculate body mass index (BMI), which is a measurement that tells if you are at a healthy weight.  Heart rate and blood pressure.  Your skin for abnormal spots. Counseling Your health care provider may ask you questions about:  Alcohol, tobacco, and drug use.  Emotional well-being.  Home and relationship well-being.  Sexual activity.  Eating habits.  Work and work environment. What immunizations do I need?  Influenza (flu) vaccine  This is recommended every year. Tetanus, diphtheria, and pertussis (Tdap) vaccine  You may need a Td booster every 10 years. Varicella (chickenpox) vaccine  You may need this vaccine if you have not already been vaccinated. Zoster (shingles) vaccine  You may need this after age 60. Measles, mumps, and rubella (MMR) vaccine  You may need at least one dose of MMR if you were born in 1957 or later. You may also need a second dose. Pneumococcal conjugate (PCV13) vaccine  You may need this if you have certain conditions and were not previously vaccinated. Pneumococcal polysaccharide (PPSV23) vaccine  You may need one or two doses if you smoke cigarettes or if you have certain conditions. Meningococcal conjugate (MenACWY) vaccine  You may need this if you have certain conditions. Hepatitis A vaccine   You may need this if you have certain conditions or if you travel or work in places where you may be exposed to hepatitis A. Hepatitis B vaccine  You may need this if you have certain conditions or if you travel or work in places where you may be exposed to hepatitis B. Haemophilus influenzae type b (Hib) vaccine  You may need this if you have certain risk factors. Human papillomavirus (HPV) vaccine  If recommended by your health care provider, you may need three doses over 6 months. You may receive vaccines as individual doses or as more than one vaccine together in one shot (combination vaccines). Talk with your health care provider about the risks and benefits of combination vaccines. What tests do I need? Blood tests  Lipid and cholesterol levels. These may be checked every 5 years, or more frequently if you are over 50 years old.  Hepatitis C test.  Hepatitis B test. Screening  Lung cancer screening. You may have this screening every year starting at age 55 if you have a 30-pack-year history of smoking and currently smoke or have quit within the past 15 years.  Prostate cancer screening. Recommendations will vary depending on your family history and other risks.  Colorectal cancer screening. All adults should have this screening starting at age 50 and continuing until age 75. Your health care provider may recommend screening at age 45 if you are at increased risk. You will have tests every 1-10 years, depending on your results and the type of screening test.  Diabetes screening. This is done by checking your blood sugar (glucose) after you have not eaten   for a while (fasting). You may have this done every 1-3 years.  Sexually transmitted disease (STD) testing. Follow these instructions at home: Eating and drinking  Eat a diet that includes fresh fruits and vegetables, whole grains, lean protein, and low-fat dairy products.  Take vitamin and mineral supplements as recommended  by your health care provider.  Do not drink alcohol if your health care provider tells you not to drink.  If you drink alcohol: ? Limit how much you have to 0-2 drinks a day. ? Be aware of how much alcohol is in your drink. In the [place] one drink equals one 12 oz bottle of beer (355 mL), one 5 oz glass of wine (148 mL), or one 1 oz glass of hard liquor (44 mL). Lifestyle  Take daily care of your teeth and gums.  Stay active. Exercise for at least 30 minutes on 5 or more days each week.  Do not use any products that contain nicotine or tobacco, such as cigarettes, e-cigarettes, and chewing tobacco. If you need help quitting, ask your health care provider.  If you are sexually active, practice safe sex. Use a condom or other form of protection to prevent STIs (sexually transmitted infections).  Talk with your health care provider about taking a low-dose aspirin every day starting at age 33. What's next?  Go to your health care provider once a year for a well check visit.  Ask your health care provider how often you should have your eyes and teeth checked.  Stay up to date on all vaccines. This information is not intended to replace advice given to you by your health care provider. Make sure you discuss any questions you have with your health care provider. Document Released: 02/12/2015 Document Revised: 01/10/2018 Document Reviewed: 01/10/2018 Elsevier Patient Education  2020 [employer].

## 2018-11-05 LAB — CBC WITH DIFFERENTIAL/PLATELET
Absolute Monocytes: 439 cells/uL (ref 200–950)
Basophils Absolute: 23 cells/uL (ref 0–200)
Basophils Relative: 0.4 %
Eosinophils Absolute: 63 cells/uL (ref 15–500)
Eosinophils Relative: 1.1 %
HCT: 46.1 % (ref 38.5–50.0)
Hemoglobin: 14.8 g/dL (ref 13.2–17.1)
Lymphs Abs: 1425 cells/uL (ref 850–3900)
MCH: 25.3 pg — ABNORMAL LOW (ref 27.0–33.0)
MCHC: 32.1 g/dL (ref 32.0–36.0)
MCV: 78.9 fL — ABNORMAL LOW (ref 80.0–100.0)
MPV: 11.6 fL (ref 7.5–12.5)
Monocytes Relative: 7.7 %
Neutro Abs: 3751 cells/uL (ref 1500–7800)
Neutrophils Relative %: 65.8 %
Platelets: 185 10*3/uL (ref 140–400)
RBC: 5.84 10*6/uL — ABNORMAL HIGH (ref 4.20–5.80)
RDW: 13.9 % (ref 11.0–15.0)
Total Lymphocyte: 25 %
WBC: 5.7 10*3/uL (ref 3.8–10.8)

## 2018-11-05 LAB — VITAMIN B12: Vitamin B-12: 361 pg/mL (ref 200–1100)

## 2018-11-05 LAB — VITAMIN D 25 HYDROXY (VIT D DEFICIENCY, FRACTURES): Vit D, 25-Hydroxy: 62 ng/mL (ref 30–100)

## 2018-11-06 ENCOUNTER — Encounter: Payer: Self-pay | Admitting: Family Medicine

## 2018-12-23 DIAGNOSIS — Z794 Long term (current) use of insulin: Secondary | ICD-10-CM | POA: Diagnosis not present

## 2018-12-23 DIAGNOSIS — E119 Type 2 diabetes mellitus without complications: Secondary | ICD-10-CM | POA: Diagnosis not present

## 2018-12-23 DIAGNOSIS — E782 Mixed hyperlipidemia: Secondary | ICD-10-CM | POA: Diagnosis not present

## 2018-12-30 DIAGNOSIS — E119 Type 2 diabetes mellitus without complications: Secondary | ICD-10-CM | POA: Diagnosis not present

## 2018-12-30 DIAGNOSIS — I10 Essential (primary) hypertension: Secondary | ICD-10-CM | POA: Diagnosis not present

## 2018-12-30 DIAGNOSIS — Z794 Long term (current) use of insulin: Secondary | ICD-10-CM | POA: Diagnosis not present

## 2018-12-30 DIAGNOSIS — E782 Mixed hyperlipidemia: Secondary | ICD-10-CM | POA: Diagnosis not present

## 2019-03-03 LAB — MICROALBUMIN, URINE: Microalb, Ur: <7

## 2019-03-28 DIAGNOSIS — Z794 Long term (current) use of insulin: Secondary | ICD-10-CM | POA: Diagnosis not present

## 2019-03-28 DIAGNOSIS — E119 Type 2 diabetes mellitus without complications: Secondary | ICD-10-CM | POA: Diagnosis not present

## 2019-03-28 DIAGNOSIS — E782 Mixed hyperlipidemia: Secondary | ICD-10-CM | POA: Diagnosis not present

## 2019-03-31 DIAGNOSIS — Z794 Long term (current) use of insulin: Secondary | ICD-10-CM | POA: Diagnosis not present

## 2019-03-31 DIAGNOSIS — E119 Type 2 diabetes mellitus without complications: Secondary | ICD-10-CM | POA: Diagnosis not present

## 2019-03-31 DIAGNOSIS — I10 Essential (primary) hypertension: Secondary | ICD-10-CM | POA: Diagnosis not present

## 2019-03-31 DIAGNOSIS — E782 Mixed hyperlipidemia: Secondary | ICD-10-CM | POA: Diagnosis not present

## 2019-06-27 DIAGNOSIS — E119 Type 2 diabetes mellitus without complications: Secondary | ICD-10-CM | POA: Diagnosis not present

## 2019-06-27 DIAGNOSIS — H04123 Dry eye syndrome of bilateral lacrimal glands: Secondary | ICD-10-CM | POA: Diagnosis not present

## 2019-06-27 DIAGNOSIS — E089 Diabetes mellitus due to underlying condition without complications: Secondary | ICD-10-CM | POA: Diagnosis not present

## 2019-06-27 DIAGNOSIS — H524 Presbyopia: Secondary | ICD-10-CM | POA: Diagnosis not present

## 2019-06-27 LAB — HM DIABETES EYE EXAM

## 2019-07-01 DIAGNOSIS — Z794 Long term (current) use of insulin: Secondary | ICD-10-CM | POA: Diagnosis not present

## 2019-07-01 DIAGNOSIS — E119 Type 2 diabetes mellitus without complications: Secondary | ICD-10-CM | POA: Diagnosis not present

## 2019-07-01 DIAGNOSIS — E782 Mixed hyperlipidemia: Secondary | ICD-10-CM | POA: Diagnosis not present

## 2019-07-02 DIAGNOSIS — I10 Essential (primary) hypertension: Secondary | ICD-10-CM | POA: Diagnosis not present

## 2019-07-02 DIAGNOSIS — Z794 Long term (current) use of insulin: Secondary | ICD-10-CM | POA: Diagnosis not present

## 2019-07-02 DIAGNOSIS — E782 Mixed hyperlipidemia: Secondary | ICD-10-CM | POA: Diagnosis not present

## 2019-07-02 DIAGNOSIS — E119 Type 2 diabetes mellitus without complications: Secondary | ICD-10-CM | POA: Diagnosis not present

## 2019-08-31 LAB — HEMOGLOBIN A1C: Hemoglobin A1C: 7.3

## 2019-09-23 ENCOUNTER — Other Ambulatory Visit: Payer: Self-pay

## 2019-09-23 DIAGNOSIS — R972 Elevated prostate specific antigen [PSA]: Secondary | ICD-10-CM

## 2019-09-24 ENCOUNTER — Other Ambulatory Visit: Payer: BC Managed Care – PPO

## 2019-09-24 ENCOUNTER — Other Ambulatory Visit: Payer: Self-pay

## 2019-09-24 DIAGNOSIS — R972 Elevated prostate specific antigen [PSA]: Secondary | ICD-10-CM

## 2019-09-24 DIAGNOSIS — N400 Enlarged prostate without lower urinary tract symptoms: Secondary | ICD-10-CM | POA: Diagnosis not present

## 2019-09-25 LAB — PSA: Prostate Specific Ag, Serum: 2.4 ng/mL (ref 0.0–4.0)

## 2019-09-26 ENCOUNTER — Other Ambulatory Visit: Payer: Self-pay

## 2019-09-26 ENCOUNTER — Ambulatory Visit (INDEPENDENT_AMBULATORY_CARE_PROVIDER_SITE_OTHER): Payer: BC Managed Care – PPO | Admitting: Urology

## 2019-09-26 ENCOUNTER — Encounter: Payer: Self-pay | Admitting: Urology

## 2019-09-26 VITALS — BP 170/83 | HR 79 | Ht 65.0 in | Wt 180.0 lb

## 2019-09-26 DIAGNOSIS — Z87898 Personal history of other specified conditions: Secondary | ICD-10-CM

## 2019-09-26 DIAGNOSIS — N400 Enlarged prostate without lower urinary tract symptoms: Secondary | ICD-10-CM

## 2019-09-26 NOTE — Progress Notes (Signed)
09/26/2019 11:17 AM   Jack Cardenas 11/17/1965 [id number]  Referring provider: Alba Cory, MD [address] [place],  [place] [address]  Chief Complaint  Patient presents with  . Elevated PSA    Urologic history: 1.Elevated PSA -Prostate biopsy 12/2016; PSA 5.5; volume 33 g; benign pathology  HPI: 54 y.o. male presents for annual follow-up.   No complaints since last years visit  No bothersome LUTS  Denies dysuria, gross hematuria  Denies flank, abdominal or pelvic pain  PSA 09/24/2019 stable at 2.4      PMH: Past Medical History:  Diagnosis Date  . Diabetes mellitus without complication (HCC)   . Hyperlipidemia   . Hypertension   . MVP (mitral valve prolapse)    Dx'd as teenager. No issues.  Followed by PCP.    Surgical History: Past Surgical History:  Procedure Laterality Date  . COLONOSCOPY WITH PROPOFOL N/A 12/11/2014   Procedure: COLONOSCOPY WITH PROPOFOL;  Surgeon: Midge Minium, MD;  Location: [facility];  Service: Endoscopy;  Laterality: N/A;  Diabetic - insulin  . NO PAST SURGERIES      Home Medications:  Allergies as of 09/26/2019   No Known Allergies     Medication List       Accurate as of September 26, 2019 11:17 AM. If you have any questions, ask your nurse or doctor.        Accu-Chek FastClix Lancets Misc Use to check blood sugars 3 times a day   amLODipine 5 MG tablet Commonly known as: NORVASC Take 1 tablet once a day for blood pressure   aspirin EC 81 MG tablet Take 1 tablet by mouth daily.   atorvastatin 40 MG tablet Commonly known as: LIPITOR Take 1 tablet by mouth once a day   BD Pen Needle Nano U/F 32G X 4 MM Misc Generic drug: Insulin Pen Needle Use as directed for insulin injection once a day   ezetimibe 10 MG tablet Commonly known as: ZETIA Take 1 tablet once a day for cholesterol   FISH OIL PO Take by mouth.   GlucoCom Blood Glucose Monitor Devi Use to test blood sugars four times  a day   Lantus SoloStar 100 UNIT/ML Solostar Pen Generic drug: insulin glargine Inject into the skin daily. Sliding Scale   multivitamin tablet Take 1 tablet by mouth daily.   nateglinide 120 MG tablet Commonly known as: STARLIX Take 1 tablet by mouth daily before supper.   Accu-Chek Aviva Plus test strip Generic drug: glucose blood Use to check blood sugars 3 times a day   OneTouch Verio test strip Generic drug: glucose blood   ramipril 10 MG capsule Commonly known as: ALTACE   terbinafine 1 % cream Commonly known as: LamISIL AT Apply 1 application topically 2 (two) times daily.   Vitamin D3 50 MCG (2000 UT) capsule Take 1 capsule once a day       Allergies: No Known Allergies  Family History: Family History  Problem Relation Age of Onset  . Renal Disease Father   . Diabetes Father   . Thyroid disease Sister   . Metabolic syndrome Sister   . Eczema Sister   . Prostate cancer Paternal Grandfather   . Colon cancer Paternal Grandfather     Social History:  reports that he has never smoked. He has never used smokeless tobacco. He reports that he does not drink alcohol and does not use drugs.   Physical Exam: BP (!) 170/83   Pulse 79  Ht 5\' 5"  (1.651 m)   Wt 180 lb (81.6 kg)   BMI 29.95 kg/m   Constitutional:  Alert and oriented, No acute distress. HEENT: Jack AT, moist mucus membranes.  Trachea midline, no masses. Cardiovascular: No clubbing, cyanosis, or edema. Respiratory: Normal respiratory effort, no increased work of breathing. GI: Abdomen is soft, nontender, nondistended, no abdominal masses GU: Prostate 40 g, smooth without nodules Skin: No rashes, bruises or suspicious lesions. Neurologic: Grossly intact, no focal deficits, moving all 4 extremities. Psychiatric: Normal mood and affect.   Assessment & Plan:    1.  History elevated PSA  Stable PSA with benign DRE  Continue annual follow-up  2. BPH without LUTS   ,  MD  [facility] [address] [place], [place] [phone]

## 2019-09-29 DIAGNOSIS — E119 Type 2 diabetes mellitus without complications: Secondary | ICD-10-CM | POA: Diagnosis not present

## 2019-09-29 DIAGNOSIS — E782 Mixed hyperlipidemia: Secondary | ICD-10-CM | POA: Diagnosis not present

## 2019-09-29 DIAGNOSIS — E559 Vitamin D deficiency, unspecified: Secondary | ICD-10-CM | POA: Diagnosis not present

## 2019-09-29 DIAGNOSIS — Z794 Long term (current) use of insulin: Secondary | ICD-10-CM | POA: Diagnosis not present

## 2019-10-01 DIAGNOSIS — E559 Vitamin D deficiency, unspecified: Secondary | ICD-10-CM | POA: Diagnosis not present

## 2019-10-01 DIAGNOSIS — I10 Essential (primary) hypertension: Secondary | ICD-10-CM | POA: Diagnosis not present

## 2019-10-01 DIAGNOSIS — E119 Type 2 diabetes mellitus without complications: Secondary | ICD-10-CM | POA: Diagnosis not present

## 2019-10-01 DIAGNOSIS — E782 Mixed hyperlipidemia: Secondary | ICD-10-CM | POA: Diagnosis not present

## 2019-11-03 NOTE — Progress Notes (Signed)
Name: Jack Cardenas   MRN: [id number]    DOB: 1965-09-11   Date:11/05/2019       Progress Note  Subjective  Chief Complaint  Chief Complaint  Patient presents with  . Annual Exam    HPI  Patient presents for annual CPE.  Diet: high protein, lots of fiber, and low sugar  Exercise: he just resumed going back the gym.  USPSTF grade A and B recommendations   IPSS Questionnaire (AUA-7): Over the past month.   1)  How often have you had a sensation of not emptying your bladder completely after you finish urinating?  0 - Not at all  2)  How often have you had to urinate again less than two hours after you finished urinating? 1 -less than 1 in 5   3)  How often have you found you stopped and started again several times when you urinated?  0 - Not at all  4) How difficult have you found it to postpone urination?  0 - Not at all  5) How often have you had a weak urinary stream?  0 - Not at all  6) How often have you had to push or strain to begin urination?  0 - Not at all  7) How many times did you most typically get up to urinate from the time you went to bed until the time you got up in the morning?  2 -   Total score:  0-7 mildly symptomatic   8-19 moderately symptomatic   20-35 severely symptomatic     Depression: phq 9 is negative Depression screen [facility] 2/9 11/05/2019 11/04/2018 11/02/2017 10/27/2016 10/27/2015  Decreased Interest 0 0 0 0 0  Down, Depressed, Hopeless 0 0 0 0 0  PHQ - 2 Score 0 0 0 0 0  Altered sleeping - 0 - - -  Tired, decreased energy - 0 - - -  Change in appetite - 0 - - -  Feeling bad or failure about yourself  - 0 - - -  Trouble concentrating - 0 - - -  Moving slowly or fidgety/restless - 0 - - -  Suicidal thoughts - 0 - - -  PHQ-9 Score - 0 - - -  Difficult doing work/chores - Not difficult at all - - -    Hypertension:  BP Readings from Last 3 Encounters:  11/05/19 122/78  09/26/19 (!) 170/83  11/04/18 122/64    Obesity: Wt Readings from Last 3  Encounters:  11/05/19 183 lb 14.4 oz (83.4 kg)  09/26/19 180 lb (81.6 kg)  11/04/18 183 lb (83 kg)   BMI Readings from Last 3 Encounters:  11/05/19 30.60 kg/m  09/26/19 29.95 kg/m  11/04/18 30.45 kg/m     Lipids:  Lab Results  Component Value Date   CHOL 116 09/17/2018   CHOL 98 09/10/2017   Lab Results  Component Value Date   HDL 40 09/17/2018   HDL 39 09/10/2017   Lab Results  Component Value Date   LDLCALC 62 09/17/2018   LDLCALC 49 09/10/2017   Lab Results  Component Value Date   TRIG 51 09/17/2018   TRIG 39 (A) 09/10/2017   No results found for: CHOLHDL No results found for: LDLDIRECT Glucose:  Glucose  Date Value Ref Range Status  10/14/2014 100 (H) 65 - 99 mg/dL Final   Glucose, Bld  Date Value Ref Range Status  10/27/2016 59 (L) 65 - 99 mg/dL Final    Comment:    .  Fasting reference interval .    Glucose-Capillary  Date Value Ref Range Status  12/11/2014 88 65 - 99 mg/dL Final      [facility]  AUDIT-C Score 0       Married STD testing and prevention (HIV/chl/gon/syphilis): not interested  Hep C: 2019  Skin cancer: Discussed monitoring for atypical lesions Colorectal cancer: repeat 2026  Prostate cancer: under the care of Dr.Stoioff    Lung cancer: Never Smoker:Low Dose CT Chest recommended if Age 1-80 years, 30 pack-year currently smoking OR have quit w/in 15years. Patient does not qualify.    ECG:  10/27/2015  Vaccines:  Pneumonia: educated and discussed with patient. Flu: educated and discussed with patient.  Advanced Care Planning: A voluntary discussion about advance care planning including the explanation and discussion of advance directives.  Discussed health care proxy and Living will, and the patient was able to identify a health care proxy as his wife .  Patient does not have a living will at present time.   Patient Active Problem List   Diagnosis Date  Noted  . BPH without obstruction/lower urinary tract symptoms 09/26/2019  . History of elevated PSA 09/24/2018  . Type 2 diabetes mellitus without complication, with long-term current use of insulin (HCC) 03/03/2016  . Mixed dyslipidemia 03/03/2016  . Hypertension, essential 03/03/2016  . Vitamin D deficiency 03/03/2016  . Special screening for malignant neoplasms, colon     Past Surgical History:  Procedure Laterality Date  . COLONOSCOPY WITH PROPOFOL N/A 12/11/2014   Procedure: COLONOSCOPY WITH PROPOFOL;  Surgeon: Midge Miniumarren Wohl, MD;  [facility];  Service: Endoscopy;  Laterality: N/A;  Diabetic - insulin  . NO PAST SURGERIES      Family History  Problem Relation Age of Onset  . Renal Disease Father   . Diabetes Father   . Thyroid disease Sister   . Metabolic syndrome Sister   . Eczema Sister   . Prostate cancer Paternal Grandfather   . Colon cancer Paternal Grandfather     Social History   Socioeconomic History  . Marital status: Married    Spouse name: Deidra   . Number of children: 2  . Years of education: Not on file  . Highest education level: Bachelor's degree (e.g., BA, AB, BS)  Occupational History  . 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 Smoking status: Never Smoker  . Smokeless tobacco: Never Used  Vaping Use  . Vaping Use: Never used  Substance and Sexual Activity  . Alcohol use: No    Alcohol/week: 0.0 standard drinks  . Drug use: No  . Sexual activity: Yes    Partners: Female  Other Topics Concern  . Not on file  Social History Narrative  . Not on file   Social Determinants of Health   Financial Resource Strain: Low Risk   . Difficulty of Paying Living Expenses: Not hard at all  Food Insecurity: No Food Insecurity  . Worried About Programme researcher, broadcasting/film/videounning Out of Food in the Last Year: Never true  . Ran Out of Food in the Last Year: Never true  Transportation Needs: No Transportation Needs  . Lack of  Transportation (Medical): No  . Lack of Transportation (Non-Medical): No  Physical Activity: Sufficiently Active  . Days of Exercise per Week: 5 days  . Minutes of Exercise per Session: 70 min  Stress: No Stress Concern Present  . Feeling of Stress :  Not at all  Social Connections: Socially Integrated  . Frequency of Communication with Friends and Family: More than three times a week  . Frequency of Social Gatherings with Friends and Family: Twice a week  . Attends Religious Services: More than 4 times per year  . Active Member of Clubs or Organizations: Yes  . Attends Banker Meetings: More than 4 times per year  . Marital Status: Married  Catering manager Violence: Not At Risk  . Fear of Current or Ex-Partner: No  . Emotionally Abused: No  . Physically Abused: No  . Sexually Abused: No     Current Outpatient Medications:  .  ACCU-CHEK FASTCLIX LANCETS MISC, Use to check blood sugars 3 times a day, Disp: , Rfl:  .  amLODipine (NORVASC) 5 MG tablet, Take 1 tablet once a day for blood pressure, Disp: , Rfl:  .  aspirin EC 81 MG tablet, Take 1 tablet by mouth daily., Disp: , Rfl:  .  atorvastatin (LIPITOR) 40 MG tablet, Take 1 tablet by mouth once a day, Disp: , Rfl:  .  Blood Glucose Monitoring Suppl (GLUCOCOM BLOOD GLUCOSE MONITOR) DEVI, Use to test blood sugars four times a day, Disp: , Rfl:  .  Cholecalciferol (VITAMIN D3) 2000 units capsule, Take 1 capsule once a day, Disp: , Rfl:  .  ezetimibe (ZETIA) 10 MG tablet, Take 1 tablet once a day for cholesterol, Disp: , Rfl:  .  glucose blood (ACCU-CHEK AVIVA PLUS) test strip, Use to check blood sugars 3 times a day, Disp: , Rfl:  .  Insulin Glargine (LANTUS SOLOSTAR) 100 UNIT/ML Solostar Pen, Inject into the skin daily. Sliding Scale, Disp: , Rfl:  .  Insulin Pen Needle (BD PEN NEEDLE NANO U/F) 32G X 4 MM MISC, Use as directed for insulin injection once a day, Disp: , Rfl:  .  Multiple Vitamin (MULTIVITAMIN) tablet, Take  1 tablet by mouth daily., Disp: , Rfl:  .  nateglinide (STARLIX) 120 MG tablet, Take 1 tablet by mouth daily before supper., Disp: , Rfl:  .  Omega-3 Fatty Acids (FISH OIL PO), Take by mouth., Disp: , Rfl:  .  ONETOUCH VERIO test strip, , Disp: , Rfl:  .  ramipril (ALTACE) 10 MG capsule, , Disp: , Rfl:  .  terbinafine (LAMISIL AT) 1 % cream, Apply 1 application topically 2 (two) times daily., Disp: 30 g, Rfl: 0  No Known Allergies   ROS  Constitutional: Negative for fever or weight change.  Respiratory: Negative for cough and shortness of breath.   Cardiovascular: Negative for chest pain or palpitations.  Gastrointestinal: Negative for abdominal pain, no bowel changes.  Musculoskeletal: Negative for gait problem or joint swelling.  Skin: Negative for rash.  Neurological: Negative for dizziness or headache.  No other specific complaints in a complete review of systems (except as listed in HPI above).   Objective  Vitals:   11/05/19 0803  BP: 122/78  Pulse: 85  Resp: 18  Temp: 98.3 F (36.8 C)  SpO2: 96%  Weight: 183 lb 14.4 oz (83.4 kg)  Height: 5\' 5"  (1.651 m)    Body mass index is 30.6 kg/m.  Physical Exam  Constitutional: Patient appears well-developed and well-nourished, muscular . No distress.  HENT: Head: Normocephalic and atraumatic. Ears: B TMs ok, no erythema or effusion; Nose/ Mouth/Throat:wearing a mask  Eyes: Conjunctivae and EOM are normal. Pupils are equal, round, and reactive to light. No scleral icterus.  Neck: Normal range of motion.  Neck supple. No JVD present. No thyromegaly present.  Cardiovascular: Normal rate, regular rhythm and normal heart sounds.  No murmur heard. No BLE edema. Pulmonary/Chest: Effort normal and breath sounds normal. No respiratory distress. Abdominal: Soft. Bowel sounds are normal, no distension. There is no tenderness. no masses MALE GENITALIA: Not done, seen by urologist  RECTAL: not done  Musculoskeletal: Normal range of  motion, no joint effusions. No gross deformities Neurological: he is alert and oriented to person, place, and time. No cranial nerve deficit. Coordination, balance, strength, speech and gait are normal.  Skin: Skin is warm and dry. No rash noted. No erythema.  Psychiatric: Patient has a normal mood and affect. behavior is normal. Judgment and thought content normal.   Recent Results (from the past 2160 hour(s))  Hemoglobin A1c     Status: None   Collection Time: 08/31/19 12:00 AM  Result Value Ref Range   Hemoglobin A1C 7.3   PSA     Status: None   Collection Time: 09/24/19 11:28 AM  Result Value Ref Range   Prostate Specific Ag, Serum 2.4 0.0 - 4.0 ng/mL    Comment: Roche ECLIA methodology. According to the American Urological Association, Serum PSA should decrease and remain at undetectable levels after radical prostatectomy. The AUA defines biochemical recurrence as an initial PSA value 0.2 ng/mL or greater followed by a subsequent confirmatory PSA value 0.2 ng/mL or greater. Values obtained with different assay methods or kits cannot be used interchangeably. Results cannot be interpreted as absolute evidence of the presence or absence of malignant disease.      Fall Risk: Fall Risk  11/05/2019 11/04/2018 11/02/2017 10/27/2016 10/27/2015  Falls in the past year? 0 0 No No No  Number falls in past yr: 0 0 - - -  Injury with Fall? 0 0 - - -     Functional Status Survey: Is the patient deaf or have difficulty hearing?: No Does the patient have difficulty seeing, even when wearing glasses/contacts?: No Does the patient have difficulty concentrating, remembering, or making decisions?: No Does the patient have difficulty walking or climbing stairs?: No Does the patient have difficulty dressing or bathing?: No Does the patient have difficulty doing errands alone such as visiting a doctor's office or shopping?: No    Assessment & Plan  1. Well adult exam   2. Needs flu  shot  Today    -Prostate cancer screening and PSA options (with potential risks and benefits of testing vs not testing) were discussed along with recent recs/guidelines. -USPSTF grade A and B recommendations reviewed with patient; age-appropriate recommendations, preventive care, screening tests, etc discussed and encouraged; healthy living encouraged; see AVS for patient education given to patient -Discussed importance of 150 minutes of physical activity weekly, eat two servings of fish weekly, eat one serving of tree nuts ( cashews, pistachios, pecans, almonds.Marland Kitchen) every other day, eat 6 servings of fruit/vegetables daily and drink plenty of water and avoid sweet beverages.

## 2019-11-05 ENCOUNTER — Encounter: Payer: Self-pay | Admitting: Family Medicine

## 2019-11-05 ENCOUNTER — Other Ambulatory Visit: Payer: Self-pay

## 2019-11-05 ENCOUNTER — Ambulatory Visit (INDEPENDENT_AMBULATORY_CARE_PROVIDER_SITE_OTHER): Payer: BC Managed Care – PPO | Admitting: Family Medicine

## 2019-11-05 VITALS — BP 122/78 | HR 85 | Temp 98.3°F | Resp 18 | Ht 65.0 in | Wt 183.9 lb

## 2019-11-05 DIAGNOSIS — Z23 Encounter for immunization: Secondary | ICD-10-CM | POA: Diagnosis not present

## 2019-11-05 DIAGNOSIS — Z0000 Encounter for general adult medical examination without abnormal findings: Secondary | ICD-10-CM | POA: Diagnosis not present

## 2019-11-05 NOTE — Patient Instructions (Addendum)
shingrix vaccine series of two, call us when you are ready  Research SGL2 agonist Wilder Glade, Vania Rea)  GLP1 agonist - Ozempic, Trulicity   Preventive Care 55-55 Years Old, Male Preventive care refers to lifestyle choices and visits with your health care provider that can promote health and wellness. This includes:  A yearly physical exam. This is also called an annual well check.  Regular dental and eye exams.  Immunizations.  Screening for certain conditions.  Healthy lifestyle choices, such as eating a healthy diet, getting regular exercise, not using drugs or products that contain nicotine and tobacco, and limiting alcohol use. What can I expect for my preventive care visit? Physical exam Your health care provider will check:  Height and weight. These may be used to calculate body mass index (BMI), which is a measurement that tells if you are at a healthy weight.  Heart rate and blood pressure.  Your skin for abnormal spots. Counseling Your health care provider may ask you questions about:  Alcohol, tobacco, and drug use.  Emotional well-being.  Home and relationship well-being.  Sexual activity.  Eating habits.  Work and work Statistician. What immunizations do I need?  Influenza (flu) vaccine  This is recommended every year. Tetanus, diphtheria, and pertussis (Tdap) vaccine  You may need a Td booster every 10 years. Varicella (chickenpox) vaccine  You may need this vaccine if you have not already been vaccinated. Zoster (shingles) vaccine  You may need this after age 49. Measles, mumps, and rubella (MMR) vaccine  You may need at least one dose of MMR if you were born in 1957 or later. You may also need a second dose. Pneumococcal conjugate (PCV13) vaccine  You may need this if you have certain conditions and were not previously vaccinated. Pneumococcal polysaccharide (PPSV23) vaccine  You may need one or two doses if you smoke cigarettes or if you  have certain conditions. Meningococcal conjugate (MenACWY) vaccine  You may need this if you have certain conditions. Hepatitis A vaccine  You may need this if you have certain conditions or if you travel or work in places where you may be exposed to hepatitis A. Hepatitis B vaccine  You may need this if you have certain conditions or if you travel or work in places where you may be exposed to hepatitis B. Haemophilus influenzae type b (Hib) vaccine  You may need this if you have certain risk factors. Human papillomavirus (HPV) vaccine  If recommended by your health care provider, you may need three doses over 6 months. You may receive vaccines as individual doses or as more than one vaccine together in one shot (combination vaccines). Talk with your health care provider about the risks and benefits of combination vaccines. What tests do I need? Blood tests  Lipid and cholesterol levels. These may be checked every 5 years, or more frequently if you are over 54 years old.  Hepatitis C test.  Hepatitis B test. Screening  Lung cancer screening. You may have this screening every year starting at age 24 if you have a 30-pack-year history of smoking and currently smoke or have quit within the past 15 years.  Prostate cancer screening. Recommendations will vary depending on your family history and other risks.  Colorectal cancer screening. All adults should have this screening starting at age 2 and continuing until age 48. Your health care provider may recommend screening at age 45 if you are at increased risk. You will have tests every 1-10 years, depending  on your results and the type of screening test.  Diabetes screening. This is done by checking your blood sugar (glucose) after you have not eaten for a while (fasting). You may have this done every 1-3 years.  Sexually transmitted disease (STD) testing. Follow these instructions at home: Eating and drinking  Eat a diet that  includes fresh fruits and vegetables, whole grains, lean protein, and low-fat dairy products.  Take vitamin and mineral supplements as recommended by your health care provider.  Do not drink alcohol if your health care provider tells you not to drink.  If you drink alcohol: ? Limit how much you have to 0-2 drinks a day. ? Be aware of how much alcohol is in your drink. In the [place] one drink equals one 12 oz bottle of beer (355 mL), one 5 oz glass of wine (148 mL), or one 1 oz glass of hard liquor (44 mL). Lifestyle  Take daily care of your teeth and gums.  Stay active. Exercise for at least 30 minutes on 5 or more days each week.  Do not use any products that contain nicotine or tobacco, such as cigarettes, e-cigarettes, and chewing tobacco. If you need help quitting, ask your health care provider.  If you are sexually active, practice safe sex. Use a condom or other form of protection to prevent STIs (sexually transmitted infections).  Talk with your health care provider about taking a low-dose aspirin every day starting at age 75. What's next?  Go to your health care provider once a year for a well check visit.  Ask your health care provider how often you should have your eyes and teeth checked.  Stay up to date on all vaccines. This information is not intended to replace advice given to you by your health care provider. Make sure you discuss any questions you have with your health care provider. Document Revised: 01/10/2018 Document Reviewed: 01/10/2018 Elsevier Patient Education  2020 [employer].

## 2019-11-13 ENCOUNTER — Encounter: Payer: Self-pay | Admitting: Family Medicine

## 2020-01-12 DIAGNOSIS — Z794 Long term (current) use of insulin: Secondary | ICD-10-CM | POA: Diagnosis not present

## 2020-01-12 DIAGNOSIS — E119 Type 2 diabetes mellitus without complications: Secondary | ICD-10-CM | POA: Diagnosis not present

## 2020-01-12 DIAGNOSIS — E559 Vitamin D deficiency, unspecified: Secondary | ICD-10-CM | POA: Diagnosis not present

## 2020-01-12 DIAGNOSIS — E782 Mixed hyperlipidemia: Secondary | ICD-10-CM | POA: Diagnosis not present

## 2020-02-05 DIAGNOSIS — Z794 Long term (current) use of insulin: Secondary | ICD-10-CM | POA: Diagnosis not present

## 2020-02-05 DIAGNOSIS — E782 Mixed hyperlipidemia: Secondary | ICD-10-CM | POA: Diagnosis not present

## 2020-02-05 DIAGNOSIS — E119 Type 2 diabetes mellitus without complications: Secondary | ICD-10-CM | POA: Diagnosis not present

## 2020-02-05 DIAGNOSIS — I10 Essential (primary) hypertension: Secondary | ICD-10-CM | POA: Diagnosis not present

## 2020-05-06 DIAGNOSIS — I10 Essential (primary) hypertension: Secondary | ICD-10-CM | POA: Diagnosis not present

## 2020-05-06 DIAGNOSIS — E782 Mixed hyperlipidemia: Secondary | ICD-10-CM | POA: Diagnosis not present

## 2020-05-06 DIAGNOSIS — E119 Type 2 diabetes mellitus without complications: Secondary | ICD-10-CM | POA: Diagnosis not present

## 2020-05-06 DIAGNOSIS — Z794 Long term (current) use of insulin: Secondary | ICD-10-CM | POA: Diagnosis not present

## 2020-05-06 LAB — COMPREHENSIVE METABOLIC PANEL: Albumin: 7 — AB (ref 3.5–5.0)

## 2020-05-07 DIAGNOSIS — Z794 Long term (current) use of insulin: Secondary | ICD-10-CM | POA: Diagnosis not present

## 2020-05-07 DIAGNOSIS — E119 Type 2 diabetes mellitus without complications: Secondary | ICD-10-CM | POA: Diagnosis not present

## 2020-05-07 DIAGNOSIS — E782 Mixed hyperlipidemia: Secondary | ICD-10-CM | POA: Diagnosis not present

## 2020-05-07 DIAGNOSIS — I10 Essential (primary) hypertension: Secondary | ICD-10-CM | POA: Diagnosis not present

## 2020-07-02 DIAGNOSIS — E113291 Type 2 diabetes mellitus with mild nonproliferative diabetic retinopathy without macular edema, right eye: Secondary | ICD-10-CM | POA: Diagnosis not present

## 2020-07-02 DIAGNOSIS — E083291 Diabetes mellitus due to underlying condition with mild nonproliferative diabetic retinopathy without macular edema, right eye: Secondary | ICD-10-CM | POA: Diagnosis not present

## 2020-07-02 DIAGNOSIS — H04123 Dry eye syndrome of bilateral lacrimal glands: Secondary | ICD-10-CM | POA: Diagnosis not present

## 2020-07-02 DIAGNOSIS — H524 Presbyopia: Secondary | ICD-10-CM | POA: Diagnosis not present

## 2020-07-25 DIAGNOSIS — Z20822 Contact with and (suspected) exposure to covid-19: Secondary | ICD-10-CM | POA: Diagnosis not present

## 2020-08-17 ENCOUNTER — Other Ambulatory Visit: Payer: Self-pay | Admitting: *Deleted

## 2020-08-17 DIAGNOSIS — R972 Elevated prostate specific antigen [PSA]: Secondary | ICD-10-CM

## 2020-08-30 DIAGNOSIS — E119 Type 2 diabetes mellitus without complications: Secondary | ICD-10-CM | POA: Diagnosis not present

## 2020-08-30 DIAGNOSIS — E782 Mixed hyperlipidemia: Secondary | ICD-10-CM | POA: Diagnosis not present

## 2020-08-30 DIAGNOSIS — Z794 Long term (current) use of insulin: Secondary | ICD-10-CM | POA: Diagnosis not present

## 2020-08-30 DIAGNOSIS — I10 Essential (primary) hypertension: Secondary | ICD-10-CM | POA: Diagnosis not present

## 2020-08-30 LAB — LIPID PANEL
Cholesterol: 118 (ref 0–200)
HDL: 49 (ref 35–70)
LDL Cholesterol: 57
Triglycerides: 34 — AB (ref 40–160)

## 2020-08-30 LAB — HEMOGLOBIN A1C: Hemoglobin A1C: 7.7

## 2020-08-30 LAB — BASIC METABOLIC PANEL: Glucose: 60

## 2020-08-31 DIAGNOSIS — E1122 Type 2 diabetes mellitus with diabetic chronic kidney disease: Secondary | ICD-10-CM | POA: Diagnosis not present

## 2020-08-31 DIAGNOSIS — E1169 Type 2 diabetes mellitus with other specified complication: Secondary | ICD-10-CM | POA: Diagnosis not present

## 2020-08-31 DIAGNOSIS — E11649 Type 2 diabetes mellitus with hypoglycemia without coma: Secondary | ICD-10-CM | POA: Diagnosis not present

## 2020-08-31 DIAGNOSIS — E1165 Type 2 diabetes mellitus with hyperglycemia: Secondary | ICD-10-CM | POA: Diagnosis not present

## 2020-09-23 ENCOUNTER — Other Ambulatory Visit: Payer: Self-pay

## 2020-09-23 ENCOUNTER — Other Ambulatory Visit: Payer: BC Managed Care – PPO

## 2020-09-23 DIAGNOSIS — R972 Elevated prostate specific antigen [PSA]: Secondary | ICD-10-CM

## 2020-09-24 LAB — PSA: Prostate Specific Ag, Serum: 2.3 ng/mL (ref 0.0–4.0)

## 2020-09-27 ENCOUNTER — Ambulatory Visit: Payer: Self-pay | Admitting: Urology

## 2020-09-29 ENCOUNTER — Other Ambulatory Visit: Payer: Self-pay

## 2020-09-29 ENCOUNTER — Encounter: Payer: Self-pay | Admitting: Urology

## 2020-09-29 ENCOUNTER — Ambulatory Visit: Payer: BLUE CROSS/BLUE SHIELD | Admitting: Urology

## 2020-09-29 VITALS — BP 138/78 | HR 72 | Ht 65.0 in | Wt 180.0 lb

## 2020-09-29 DIAGNOSIS — N400 Enlarged prostate without lower urinary tract symptoms: Secondary | ICD-10-CM | POA: Diagnosis not present

## 2020-09-29 DIAGNOSIS — Z87898 Personal history of other specified conditions: Secondary | ICD-10-CM | POA: Diagnosis not present

## 2020-09-29 NOTE — Progress Notes (Signed)
09/29/2020 11:10 AM   Jack Cardenas Feb 26, 1965 [id number]  Referring provider: Alba Cory, MD [address] [place],  [place] [address]  Chief Complaint  Patient presents with   Follow-up     Urologic history: 1.  Elevated PSA -Prostate biopsy 12/2016; PSA 5.5; volume 33 g; benign pathology  HPI: 55 y.o. male presents for annual follow-up.  Doing well since last visit No bothersome LUTS Denies dysuria, gross hematuria Denies flank, abdominal or pelvic pain PSA 09/23/2020 stable at 2.3    PMH: Past Medical History:  Diagnosis Date   Diabetes mellitus without complication (HCC)    Hyperlipidemia    Hypertension    MVP (mitral valve prolapse)    Dx'd as teenager. No issues.  Followed by PCP.    Surgical History: Past Surgical History:  Procedure Laterality Date   COLONOSCOPY WITH PROPOFOL N/A 12/11/2014   Procedure: COLONOSCOPY WITH PROPOFOL;  Surgeon: Midge Minium, MD;  [facility];  Service: Endoscopy;  Laterality: N/A;  Diabetic - insulin   NO PAST SURGERIES      Home Medications:  Allergies as of 09/29/2020   No Known Allergies      Medication List        Accurate as of September 29, 2020 11:10 AM. If you have any questions, ask your nurse or doctor.          Accu-Chek FastClix Lancets Misc Use to check blood sugars 3 times a day   amLODipine 5 MG tablet Commonly known as: NORVASC Take 1 tablet once a day for blood pressure   aspirin EC 81 MG tablet Take 1 tablet by mouth daily.   atorvastatin 40 MG tablet Commonly known as: LIPITOR Take 1 tablet by mouth once a day   ezetimibe 10 MG tablet Commonly known as: ZETIA Take 1 tablet once a day for cholesterol   FISH OIL PO Take by mouth.   GlucoCom Blood Glucose Monitor Devi Use to test blood sugars four times a day   insulin glargine 100 UNIT/ML Solostar Pen Commonly known as: LANTUS Inject into the skin daily. Sliding Scale   Insulin Pen Needle 32G  X 4 MM Misc Use as directed for insulin injection once a day   multivitamin tablet Take 1 tablet by mouth daily.   nateglinide 120 MG tablet Commonly known as: STARLIX Take 1 tablet by mouth daily before supper.   glucose blood test strip Use to check blood sugars 3 times a day   OneTouch Verio test strip Generic drug: glucose blood   ramipril 10 MG capsule Commonly known as: ALTACE   terbinafine 1 % cream Commonly known as: LamISIL AT Apply 1 application topically 2 (two) times daily.   Vitamin D3 50 MCG (2000 UT) capsule Take 1 capsule once a day        Allergies: No Known Allergies  Family History: Family History  Problem Relation Age of Onset   Renal Disease Father    Diabetes Father    Thyroid disease Sister    Metabolic syndrome Sister    Eczema Sister    Prostate cancer Paternal Grandfather    Colon cancer Paternal Grandfather     Social History:  reports that he has never smoked. He has never used smokeless tobacco. He reports that he does not drink alcohol and does not use drugs.   Physical Exam: BP 138/78   Pulse 72   Ht 5\' 5"  (1.651 m)   Wt 180 lb (81.6 kg)  BMI 29.95 kg/m   Constitutional:  Alert and oriented, No acute distress. HEENT: [place] AT, moist mucus membranes.  Trachea midline, no masses. Cardiovascular: No clubbing, cyanosis, or edema. Respiratory: Normal respiratory effort, no increased work of breathing. GU: Prostate 40 g, smooth without nodules Neurologic: Grossly intact, no focal deficits, moving all 4 extremities. Psychiatric: Normal mood and affect.   Assessment & Plan:    1.  History elevated PSA Stable PSA with benign DRE Continue annual follow-up   2. BPH without LUTS   Riki Altes, MD  [facility] [address] [place], [place] [address] [phone]

## 2020-09-30 ENCOUNTER — Encounter: Payer: Self-pay | Admitting: Urology

## 2020-11-05 ENCOUNTER — Encounter: Payer: BC Managed Care – PPO | Admitting: Family Medicine

## 2020-11-18 NOTE — Patient Instructions (Signed)
Preventive Care 40-56 Years Old, Male Preventive care refers to lifestyle choices and visits with your health care provider that can promote health and wellness. This includes: A yearly physical exam. This is also called an annual wellness visit. Regular dental and eye exams. Immunizations. Screening for certain conditions. Healthy lifestyle choices, such as: Eating a healthy diet. Getting regular exercise. Not using drugs or products that contain nicotine and tobacco. Limiting alcohol use. What can I expect for my preventive care visit? Physical exam Your health care provider will check your: Height and weight. These may be used to calculate your BMI (body mass index). BMI is a measurement that tells if you are at a healthy weight. Heart rate and blood pressure. Body temperature. Skin for abnormal spots. Counseling Your health care provider may ask you questions about your: Past medical problems. Family's medical history. Alcohol, tobacco, and drug use. Emotional well-being. Home life and relationship well-being. Sexual activity. Diet, exercise, and sleep habits. Work and work environment. Access to firearms. What immunizations do I need? Vaccines are usually given at various ages, according to a schedule. Your health care provider will recommend vaccines for you based on your age, medical history, and lifestyle or other factors, such as travel or where you work. What tests do I need? Blood tests Lipid and cholesterol levels. These may be checked every 5 years, or more often if you are over 50 years old. Hepatitis C test. Hepatitis B test. Screening Lung cancer screening. You may have this screening every year starting at age 55 if you have a 30-pack-year history of smoking and currently smoke or have quit within the past 15 years. Prostate cancer screening. Recommendations will vary depending on your family history and other risks. Genital exam to check for testicular cancer  or hernias. Colorectal cancer screening. All adults should have this screening starting at age 50 and continuing until age 75. Your health care provider may recommend screening at age 45 if you are at increased risk. You will have tests every 1-10 years, depending on your results and the type of screening test. Diabetes screening. This is done by checking your blood sugar (glucose) after you have not eaten for a while (fasting). You may have this done every 1-3 years. STD (sexually transmitted disease) testing, if you are at risk. Follow these instructions at home: Eating and drinking  Eat a diet that includes fresh fruits and vegetables, whole grains, lean protein, and low-fat dairy products. Take vitamin and mineral supplements as recommended by your health care provider. Do not drink alcohol if your health care provider tells you not to drink. If you drink alcohol: Limit how much you have to 0-2 drinks a day. Be aware of how much alcohol is in your drink. In the [place] one drink equals one 12 oz bottle of beer (355 mL), one 5 oz glass of wine (148 mL), or one 1 oz glass of hard liquor (44 mL). Lifestyle Take daily care of your teeth and gums. Brush your teeth every morning and night with fluoride toothpaste. Floss one time each day. Stay active. Exercise for at least 30 minutes 5 or more days each week. Do not use any products that contain nicotine or tobacco, such as cigarettes, e-cigarettes, and chewing tobacco. If you need help quitting, ask your health care provider. Do not use drugs. If you are sexually active, practice safe sex. Use a condom or other form of protection to prevent STIs (sexually transmitted infections). If told by your   health care provider, take low-dose aspirin daily starting at age 50. Find healthy ways to cope with stress, such as: Meditation, yoga, or listening to music. Journaling. Talking to a trusted person. Spending time with friends and  family. Safety Always wear your seat belt while driving or riding in a vehicle. Do not drive: If you have been drinking alcohol. Do not ride with someone who has been drinking. When you are tired or distracted. While texting. Wear a helmet and other protective equipment during sports activities. If you have firearms in your house, make sure you follow all gun safety procedures. What's next? Go to your health care provider once a year for an annual wellness visit. Ask your health care provider how often you should have your eyes and teeth checked. Stay up to date on all vaccines. This information is not intended to replace advice given to you by your health care provider. Make sure you discuss any questions you have with your health care provider. Document Revised: 03/26/2020 Document Reviewed: 01/10/2018 Elsevier Patient Education  2022 Elsevier Inc.   

## 2020-11-18 NOTE — Progress Notes (Signed)
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 He goes to the gym on a regular basis and over the past few months noticed weakness and pain on right arm. He states he noticed decrease of strength with bench press, mild on shoulder press , triceps push down. He states after the weakness followed by pain . He states improves with heating pad or raising his arm above his head. Pain is described as tingling and aching and can radiate down to his right hand. Some discomfort with extension of right rotation with extension.   He has DM and is compliant with follow ups and medications given by Endo at [facility], last A1C 7.7 % and LDL at goal , urine micro negative, eye exam is up to date   He has a history of elevation of PSA, no symptoms and sees Urologist once a year .  IPSS Questionnaire (AUA-7): Over the past month.   1)  How often have you had a sensation of not emptying your bladder completely after you finish urinating?  0 - Not at all  2)  How often have you had to urinate again less than two hours after you finished urinating? 0 - Not at all  3)  How often have you found you stopped and started again several times when you urinated?  0 - Not at all  4) How difficult have you found it to postpone urination?  0 - Not at all  5) How often have you had a weak urinary stream?  0 - Not at all  6) How often have you had to push or strain to begin urination?  0 - Not at all  7) How many times did you most typically get up to urinate from the time you went to bed until the time you got up in the morning?  0 - None  Total score:  0-7 mildly symptomatic   8-19 moderately symptomatic    20-35 severely symptomatic     Diet: he follows a low carb diet, eats a lot of vegetables, colorful and lean meat  Exercise: works out 4-5 days per week, mostly weight training and sometimes cardio - he does a circuit   Depression: phq 9 is negative  Depression screen [facility] 2/9 11/19/2020 11/05/2019 11/04/2018 11/02/2017 10/27/2016  Decreased Interest 0 0 0 0 0  Down, Depressed, Hopeless 0 0 0 0 0  PHQ - 2 Score 0 0 0 0 0  Altered sleeping 0 - 0 - -  Tired, decreased energy 0 - 0 - -  Change in appetite 0 - 0 - -  Feeling bad or failure about yourself  0 - 0 - -  Trouble concentrating 0 - 0 - -  Moving slowly or fidgety/restless 0 - 0 - -  Suicidal thoughts 0 - 0 - -  PHQ-9 Score 0 - 0 - -  Difficult doing work/chores - - Not difficult at all - -    Hypertension:  BP Readings from Last 3 Encounters:  11/19/20 126/78  09/29/20 138/78  11/05/19 122/78    Obesity: Wt Readings from  Last 3 Encounters:  11/19/20 181 lb (82.1 kg)  09/29/20 180 lb (81.6 kg)  11/05/19 183 lb 14.4 oz (83.4 kg)   BMI Readings from Last 3 Encounters:  11/19/20 30.12 kg/m  09/29/20 29.95 kg/m  11/05/19 30.60 kg/m     Lipids:  Lab Results  Component Value Date   CHOL 116 09/17/2018   CHOL 98 09/10/2017   Lab Results  Component Value Date   HDL 40 09/17/2018   HDL 39 09/10/2017   Lab Results  Component Value Date   LDLCALC 62 09/17/2018   LDLCALC 49 09/10/2017   Lab Results  Component Value Date   TRIG 51 09/17/2018   TRIG 39 (A) 09/10/2017   No results found for: CHOLHDL No results found for: LDLDIRECT Glucose:  Glucose  Date Value Ref Range Status  10/14/2014 100 (H) 65 - 99 mg/dL Final   Glucose, Bld  Date Value Ref Range Status  10/27/2016 59 (L) 65 - 99 mg/dL Final    Comment:    .            Fasting reference interval .    Glucose-Capillary  Date Value Ref Range Status  12/11/2014 88 65 - 99 mg/dL Final    Flowsheet Row Office Visit [facility] 11/19/2020 in [facility]  AUDIT-C Score 0       Married STD testing and prevention (HIV/chl/gon/syphilis): N/A Hep C: 11/02/17  Skin cancer: Discussed monitoring for atypical lesions Colorectal cancer: 12/11/14 Prostate cancer:  Lab Results  Component Value Date   PSA 4.1 (H) 10/27/2016     Lung cancer: Low Dose CT Chest recommended if Age 43-80 years, 30 pack-year currently smoking OR have quit w/in 15years. Patient does not qualify.   AAA: The USPSTF recommends one-time screening with ultrasonography in men ages 41 to 75 years who have ever smoked ECG:  10/27/15  Vaccines:   Shingrix: today  Pneumonia: educated and discussed with patient. Flu: today  COVID-19: refused   Advanced Care Planning: A voluntary discussion about advance care planning including the explanation and discussion of advance directives.  Discussed health care proxy and Living will, and the patient was able to identify a health care proxy as wife.    Patient Active Problem List   Diagnosis Date Noted   BPH without obstruction/lower urinary tract symptoms 09/26/2019   History of elevated PSA 09/24/2018   Dyslipidemia associated with type 2 diabetes mellitus (HCC)    Mixed dyslipidemia 03/03/2016   Hypertension, essential 03/03/2016   Vitamin D deficiency 03/03/2016   Special screening for malignant neoplasms, colon     Past Surgical History:  Procedure Laterality Date   COLONOSCOPY WITH PROPOFOL N/A 12/11/2014   Procedure: COLONOSCOPY WITH PROPOFOL;  Surgeon: Midge Minium, MD;  Location: [facility];  Service: Endoscopy;  Laterality: N/A;  Diabetic - insulin   NO PAST SURGERIES      Family History  Problem Relation Age of Onset   Renal Disease Father    Diabetes Father    Thyroid disease Sister    Metabolic syndrome Sister    Eczema Sister    Prostate cancer Paternal Grandfather    Colon cancer Paternal Grandfather     Social History   Socioeconomic History   Marital status:  Married    Spouse name: Deidra    Number of children: 2   Years of education: Not on file   Highest education level: Bachelor's degree (e.g., BA, AB, BS)  Occupational History  Occupation: Teacher, adult education     Comment: Recruitment consultant   Tobacco Use   Smoking status: Never   Smokeless tobacco: Never  Vaping Use   Vaping Use: Never used  Substance and Sexual Activity   Alcohol use: No    Alcohol/week: 0.0 standard drinks   Drug use: No   Sexual activity: Yes    Partners: Female  Other Topics Concern   Not on file  Social History Narrative   Not on file   Social Determinants of Health   Financial Resource Strain: Low Risk    Difficulty of Paying Living Expenses: Not hard at all  Food Insecurity: No Food Insecurity   Worried About Programme researcher, broadcasting/film/video in the Last Year: Never true   Ran Out of Food in the Last Year: Never true  Transportation Needs: No Transportation Needs   Lack of Transportation (Medical): No   Lack of Transportation (Non-Medical): No  Physical Activity: Sufficiently Active   Days of Exercise per Week: 5 days   Minutes of Exercise per Session: 70 min  Stress: No Stress Concern Present   Feeling of Stress : Not at all  Social Connections: Socially Integrated   Frequency of Communication with Friends and Family: More than three times a week   Frequency of Social Gatherings with Friends and Family: Three times a week   Attends Religious Services: More than 4 times per year   Active Member of Clubs or Organizations: Yes   Attends Engineer, structural: More than 4 times per year   Marital Status: Married  Catering manager Violence: Not At Risk   Fear of Current or Ex-Partner: No   Emotionally Abused: No   Physically Abused: No   Sexually Abused: No     Current Outpatient Medications:    ACCU-CHEK FASTCLIX LANCETS MISC, Use to check blood sugars 3 times a day, Disp: , Rfl:    amLODipine (NORVASC) 5 MG tablet, Take 1 tablet once a day for blood  pressure, Disp: , Rfl:    aspirin EC 81 MG tablet, Take 1 tablet by mouth daily., Disp: , Rfl:    atorvastatin (LIPITOR) 40 MG tablet, Take 1 tablet by mouth once a day, Disp: , Rfl:    Blood Glucose Monitoring Suppl (GLUCOCOM BLOOD GLUCOSE MONITOR) DEVI, Use to test blood sugars four times a day, Disp: , Rfl:    Cholecalciferol (VITAMIN D3) 2000 units capsule, Take 1 capsule once a day, Disp: , Rfl:    diclofenac (VOLTAREN) 75 MG EC tablet, Take 1 tablet (75 mg total) by mouth 2 (two) times daily., Disp: 30 tablet, Rfl: 0   ezetimibe (ZETIA) 10 MG tablet, Take 1 tablet once a day for cholesterol, Disp: , Rfl:    Glucagon (GVOKE HYPOPEN 1-PACK) 1 MG/0.2ML SOAJ, Inject 1 each into the skin daily as needed., Disp: 0.4 mL, Rfl: 0   glucose blood test strip, Use to check blood sugars 3 times a day, Disp: , Rfl:    insulin glargine (LANTUS) 100 UNIT/ML Solostar Pen, Inject into the skin daily. Sliding Scale, Disp: , Rfl:    Insulin Pen Needle 32G X 4 MM MISC, Use as directed for insulin injection once a day, Disp: , Rfl:    Multiple Vitamin (MULTIVITAMIN) tablet, Take 1 tablet by mouth daily., Disp: , Rfl:    nateglinide (STARLIX) 120 MG tablet, Take 1 tablet by mouth daily before supper., Disp: , Rfl:    Omega-3 Fatty Acids (FISH OIL PO), Take by mouth.,  Disp: , Rfl:    ONETOUCH VERIO test strip, , Disp: , Rfl:    ramipril (ALTACE) 10 MG capsule, , Disp: , Rfl:   No Known Allergies   ROS  Constitutional: Negative for fever or weight change.  Respiratory: Negative for cough and shortness of breath.   Cardiovascular: Negative for chest pain or palpitations.  Gastrointestinal: Negative for abdominal pain, no bowel changes.  Musculoskeletal: Negative for gait problem or joint swelling.  Skin: Negative for rash.  Neurological: Negative for dizziness or headache.  No other specific complaints in a complete review of systems (except as listed in HPI above).    Objective  Vitals:   11/19/20  0733  BP: 126/78  Pulse: 81  Resp: 16  Temp: 98.1 F (36.7 C)  SpO2: 99%  Weight: 181 lb (82.1 kg)  Height: 5\' 5"  (1.651 m)    Body mass index is 30.12 kg/m.  Physical Exam  Constitutional: Patient appears well-developed and well-nourished. No distress.  HENT: Head: Normocephalic and atraumatic. Ears: B TMs ok, no erythema or effusion; Nose: Not done . Mouth/Throat: not done  Eyes: Conjunctivae and EOM are normal. Pupils are equal, round, and reactive to light. No scleral icterus.  Neck: Normal range of motion. Neck supple. No JVD present. No thyromegaly present. Lipoma nuchal area  Cardiovascular: Normal rate, regular rhythm and normal heart sounds.  No murmur heard. No BLE edema. Pulmonary/Chest: Effort normal and breath sounds normal. No respiratory distress. Abdominal: Soft. Bowel sounds are normal, no distension. There is no tenderness. no masses MALE GENITALIA:not done  RECTAL:not done  Musculoskeletal: No gross deformities, arms still look symmetrical , right pectoralis smaller of left side ( chronic ) , normal rom of spine Neurological: he is alert and oriented to person, place, and time. No cranial nerve deficit. Coordination, balance, strength, speech and gait are normal.  Skin: Skin is warm and dry. No rash noted. No erythema.  Psychiatric: Patient has a normal mood and affect. behavior is normal. Judgment and thought content normal.   Recent Results (from the past 2160 hour(s))  PSA     Status: None   Collection Time: 09/23/20 11:12 AM  Result Value Ref Range   Prostate Specific Ag, Serum 2.3 0.0 - 4.0 ng/mL    Comment: Roche ECLIA methodology. According to the American Urological Association, Serum PSA should decrease and remain at undetectable levels after radical prostatectomy. The AUA defines biochemical recurrence as an initial PSA value 0.2 ng/mL or greater followed by a subsequent confirmatory PSA value 0.2 ng/mL or greater. Values obtained with different  assay methods or kits cannot be used interchangeably. Results cannot be interpreted as absolute evidence of the presence or absence of malignant disease.      Fall Risk: Fall Risk  11/19/2020 11/05/2019 11/04/2018 11/02/2017 10/27/2016  Falls in the past year? 0 0 0 No No  Number falls in past yr: 0 0 0 - -  Injury with Fall? 0 0 0 - -  Risk for fall due to : No Fall Risks - - - -  Follow up Falls prevention discussed - - - -      Functional Status Survey: Is the patient deaf or have difficulty hearing?: No Does the patient have difficulty seeing, even when wearing glasses/contacts?: No Does the patient have difficulty concentrating, remembering, or making decisions?: No Does the patient have difficulty walking or climbing stairs?: No Does the patient have difficulty dressing or bathing?: No Does the patient have difficulty doing  errands alone such as visiting a doctor's office or shopping?: No  Diabetic Foot Exam - Simple   Simple Foot Form Diabetic Foot exam was performed with the following findings: Yes 11/19/2020  8:22 AM  Visual Inspection No deformities, no ulcerations, no other skin breakdown bilaterally: Yes Sensation Testing Intact to touch and monofilament testing bilaterally: Yes Pulse Check Posterior Tibialis and Dorsalis pulse intact bilaterally: Yes Comments      Assessment & Plan   1. Well adult exam  - HgB A1c  2. Need for immunization against influenza  - Flu Vaccine QUAD 6+ mos PF IM (Fluarix Quad PF)  3. Need for shingles vaccine  - Varicella-zoster vaccine IM  4. Right arm weakness  - diclofenac (VOLTAREN) 75 MG EC tablet; Take 1 tablet (75 mg total) by mouth 2 (two) times daily.  Dispense: 30 tablet; Refill: 0 - Ambulatory referral to Sports Medicine  5. Dyslipidemia associated with type 2 diabetes mellitus (HCC)  - Glucagon (GVOKE HYPOPEN 1-PACK) 1 MG/0.2ML SOAJ; Inject 1 each into the skin daily as needed.  Dispense: 0.4 mL; Refill:  0  6. Cervical radiculitis  - diclofenac (VOLTAREN) 75 MG EC tablet; Take 1 tablet (75 mg total) by mouth 2 (two) times daily.  Dispense: 30 tablet; Refill: 0 - Ambulatory referral to Sports Medicine  -Prostate cancer screening and PSA options (with potential risks and benefits of testing vs not testing) were discussed along with recent recs/guidelines. -USPSTF grade A and B recommendations reviewed with patient; age-appropriate recommendations, preventive care, screening tests, etc discussed and encouraged; healthy living encouraged; see AVS for patient education given to patient -Discussed importance of 150 minutes of physical activity weekly, eat two servings of fish weekly, eat one serving of tree nuts ( cashews, pistachios, pecans, almonds.Marland Kitchen) every other day, eat 6 servings of fruit/vegetables daily and drink plenty of water and avoid sweet beverages.

## 2020-11-19 ENCOUNTER — Ambulatory Visit (INDEPENDENT_AMBULATORY_CARE_PROVIDER_SITE_OTHER): Payer: BC Managed Care – PPO | Admitting: Family Medicine

## 2020-11-19 ENCOUNTER — Other Ambulatory Visit: Payer: Self-pay

## 2020-11-19 ENCOUNTER — Other Ambulatory Visit: Payer: Self-pay | Admitting: Family Medicine

## 2020-11-19 ENCOUNTER — Encounter: Payer: Self-pay | Admitting: Family Medicine

## 2020-11-19 VITALS — BP 126/78 | HR 81 | Temp 98.1°F | Resp 16 | Ht 65.0 in | Wt 181.0 lb

## 2020-11-19 DIAGNOSIS — Z23 Encounter for immunization: Secondary | ICD-10-CM | POA: Diagnosis not present

## 2020-11-19 DIAGNOSIS — M5412 Radiculopathy, cervical region: Secondary | ICD-10-CM | POA: Diagnosis not present

## 2020-11-19 DIAGNOSIS — E785 Hyperlipidemia, unspecified: Secondary | ICD-10-CM

## 2020-11-19 DIAGNOSIS — R29898 Other symptoms and signs involving the musculoskeletal system: Secondary | ICD-10-CM | POA: Diagnosis not present

## 2020-11-19 DIAGNOSIS — E1169 Type 2 diabetes mellitus with other specified complication: Secondary | ICD-10-CM | POA: Diagnosis not present

## 2020-11-19 DIAGNOSIS — Z0000 Encounter for general adult medical examination without abnormal findings: Secondary | ICD-10-CM | POA: Diagnosis not present

## 2020-11-19 MED ORDER — DICLOFENAC SODIUM 75 MG PO TBEC: 75.0000 mg | DELAYED_RELEASE_TABLET | Freq: Two times a day (BID) | ORAL | 0 refills | Status: DC

## 2020-11-19 MED ORDER — GVOKE HYPOPEN 1-PACK 1 MG/0.2ML ~~LOC~~ SOAJ: 1.0000 | Freq: Every day | SUBCUTANEOUS | 0 refills | Status: DC | PRN

## 2020-11-19 NOTE — Addendum Note (Signed)
Addended by: Dollene Primrose on: 11/19/2020 08:45 AM   Modules accepted: Orders

## 2020-11-25 ENCOUNTER — Ambulatory Visit
Admission: RE | Admit: 2020-11-25 | Discharge: 2020-11-25 | Disposition: A | Discharge status: Home / Self Care | Payer: BC Managed Care – PPO | Source: Ambulatory Visit | Attending: Family Medicine | Admitting: Family Medicine

## 2020-11-25 ENCOUNTER — Other Ambulatory Visit: Payer: Self-pay | Admitting: Family Medicine

## 2020-11-25 ENCOUNTER — Ambulatory Visit: Payer: BC Managed Care – PPO | Admitting: Family Medicine

## 2020-11-25 ENCOUNTER — Ambulatory Visit
Admission: RE | Admit: 2020-11-25 | Discharge: 2020-11-25 | Disposition: A | Discharge status: Home / Self Care | Payer: BC Managed Care – PPO | Attending: Family Medicine | Admitting: Family Medicine

## 2020-11-25 ENCOUNTER — Encounter: Payer: Self-pay | Admitting: Family Medicine

## 2020-11-25 ENCOUNTER — Other Ambulatory Visit: Payer: Self-pay

## 2020-11-25 VITALS — BP 164/80 | HR 95 | Ht 65.0 in | Wt 182.4 lb

## 2020-11-25 DIAGNOSIS — M62838 Other muscle spasm: Secondary | ICD-10-CM | POA: Insufficient documentation

## 2020-11-25 DIAGNOSIS — M5412 Radiculopathy, cervical region: Secondary | ICD-10-CM | POA: Insufficient documentation

## 2020-11-25 DIAGNOSIS — E1169 Type 2 diabetes mellitus with other specified complication: Secondary | ICD-10-CM

## 2020-11-25 DIAGNOSIS — M542 Cervicalgia: Secondary | ICD-10-CM | POA: Diagnosis not present

## 2020-11-25 MED ORDER — CYCLOBENZAPRINE HCL 10 MG PO TABS: 10.0000 mg | ORAL_TABLET | Freq: Every evening | ORAL | 0 refills | Status: DC | PRN

## 2020-11-25 MED ORDER — GABAPENTIN 100 MG PO CAPS: ORAL_CAPSULE | ORAL | 0 refills | Status: DC

## 2020-11-25 MED ORDER — PREDNISONE 50 MG PO TABS: 50.0000 mg | ORAL_TABLET | Freq: Every day | ORAL | 0 refills | Status: DC

## 2020-11-25 NOTE — Patient Instructions (Signed)
-   Obtain x-rays downstairs - Positive diclofenac dosing - Start prednisone, take for full 5-day course - After prednisone complete, restart diclofenac twice daily scheduled (take with food) - Dose nightly cyclobenzaprine (muscle relaxer) x4 nights, then nightly as needed for muscle tightness pain - Dose nightly gabapentin 1 capsule, increase every 3 nights until right arm shooting symptoms controlled - Hold from upper body athletics/exercise until medically cleared - Return for follow-up in 2 weeks, contact our office for questions between now and then

## 2020-11-25 NOTE — Progress Notes (Signed)
Primary Care / Sports Medicine Office Visit  Patient Information:  Patient ID: Jack Cardenas, male DOB: January 30, 1966 Age: 55 y.o. MRN: [id number]   Mirza Fessel is a pleasant 55 y.o. male presenting with the following:  Chief Complaint  Patient presents with   Weakness    Right-sided tingling and weakness; started x3 months ago; no imaging; treatment include diclofenac (some relief), deep tissue massage, raising arm above head; heavy weight lifting exercising 4-5 days weekly for 60-80 minutes; describes as throbbing, constant, tingling, radiating; lacking right pectoralis major (congenital defect); left-handedness; 2/10 pain    Review of Systems pertinent details above   Patient Active Problem List   Diagnosis Date Noted   Right cervical radiculopathy 11/25/2020   Cervical paraspinal muscle spasm 11/25/2020   BPH without obstruction/lower urinary tract symptoms 09/26/2019   History of elevated PSA 09/24/2018   Dyslipidemia associated with type 2 diabetes mellitus (HCC)    Mixed dyslipidemia 03/03/2016   Hypertension, essential 03/03/2016   Vitamin D deficiency 03/03/2016   Special screening for malignant neoplasms, colon    Past Medical History:  Diagnosis Date   Diabetes mellitus without complication (HCC)    Hyperlipidemia    Hypertension    MVP (mitral valve prolapse)    Dx'd as teenager. No issues.  Followed by PCP.   Outpatient Encounter Medications as of 11/25/2020  Medication Sig   amLODipine (NORVASC) 5 MG tablet Take 1 tablet once a day for blood pressure   aspirin EC 81 MG tablet Take 1 tablet by mouth daily.   atorvastatin (LIPITOR) 40 MG tablet Take 1 tablet by mouth once a day   Blood Glucose Monitoring Suppl (GLUCOCOM BLOOD GLUCOSE MONITOR) DEVI Use to test blood sugars four times a day   Cholecalciferol (VITAMIN D3) 2000 units capsule Take 1 capsule once a day   cyclobenzaprine (FLEXERIL) 10 MG tablet Take 1 tablet (10 mg total) by mouth at bedtime as needed  for muscle spasms.   diclofenac (VOLTAREN) 75 MG EC tablet Take 1 tablet (75 mg total) by mouth 2 (two) times daily.   ezetimibe (ZETIA) 10 MG tablet Take 1 tablet once a day for cholesterol   gabapentin (NEURONTIN) 100 MG capsule One cap PO qHS for 3 nights, then two caps qHS for 3 nights, then three cap qHS for 3 nights, stop at dose that resolves shooting arm symptoms   Glucagon (GVOKE HYPOPEN 1-PACK) 1 MG/0.2ML SOAJ Inject 1 each into the skin daily as needed.   insulin glargine (LANTUS) 100 UNIT/ML Solostar Pen Inject 15-24 Units into the skin at bedtime.   Insulin Pen Needle 32G X 4 MM MISC Use as directed for insulin injection once a day   Multiple Vitamin (MULTIVITAMIN) tablet Take 1 tablet by mouth daily.   nateglinide (STARLIX) 120 MG tablet Take 1 tablet by mouth daily before supper.   Omega-3 Fatty Acids (FISH OIL PO) Take 2 capsules by mouth daily.   ONETOUCH VERIO test strip 1 each by Other route as needed.   predniSONE (DELTASONE) 50 MG tablet Take 1 tablet (50 mg total) by mouth daily.   ramipril (ALTACE) 10 MG capsule Take 10 mg by mouth daily.   [DISCONTINUED] ACCU-CHEK FASTCLIX LANCETS MISC Use to check blood sugars 3 times a day   [DISCONTINUED] glucose blood test strip Use to check blood sugars 3 times a day   No facility-administered encounter medications on file as of 11/25/2020.   Past Surgical History:  Procedure Laterality Date  COLONOSCOPY WITH PROPOFOL N/A 12/11/2014   Procedure: COLONOSCOPY WITH PROPOFOL;  Surgeon: Midge Minium, MD;  Location: [facility];  Service: Endoscopy;  Laterality: N/A;  Diabetic - insulin    Vitals:   11/25/20 1120  BP: (!) 164/80  Pulse: 95  SpO2: 99%   Vitals:   11/25/20 1120  Weight: 182 lb 6.4 oz (82.7 kg)  Height: 5\' 5"  (1.651 m)   Body mass index is 30.35 kg/m.  No results found.   Independent interpretation of notes and tests performed by another provider:   None  Procedures performed:    None  Pertinent History, Exam, Impression, and Recommendations:   Right cervical radiculopathy Left-hand-dominant patient with roughly 87-month history of right paraspinal pain with radiation to the right fingertips involving paresthesias and weakness.  He is highly active at baseline with weightlifting and has noted decreased strength during this timeframe.  He does give history of deep tissue massage at onset, vague history of similar symptoms in the distant past to the contralateral side that resolved after physical therapy.  He has been dosing diclofenac 75mg  from his primary care provider Dr. 2-month and noting improvement without resolution.  Continues to attempt to remain highly active in the gym though with persistent weakness.  He does have an element of nighttime pain.  Physical examination reveals limited rightward torsion and lateral flexion, 5 -/5 with resisted right shoulder abduction and flexion/extension at the right elbow, contralateral 5/5, sensation appears symmetric bilaterally throughout the upper extremities, he does have positive Spurling's testing on the right, contralateral negative, there is paraspinal muscular spasm on the right in the right upper trapezius and levator scapular regions.  I have advised a transition to a 5-day course of prednisone 50 mg daily, following this he will restart diclofenac 75 mg as previously prescribed, knows nightly cyclobenzaprine x4 nights then as needed, initiate gradually titrating dose of gabapentin with a goal of remaining at lowest dose necessary to mitigate paresthesias.  I have advised him to refrain from upper body athletics/working out until medically cleared, we will obtain plain films of his cervical spine, and have him return for follow-up in 2 weeks.  For suboptimal progress, pending x-rays, advanced imaging, PT, pharmacotherapy management to be considered.  If improved, PT, wean from medications, and consider mild  athletic activity increase.   Orders & Medications Meds ordered this encounter  Medications   predniSONE (DELTASONE) 50 MG tablet    Sig: Take 1 tablet (50 mg total) by mouth daily.    Dispense:  5 tablet    Refill:  0   gabapentin (NEURONTIN) 100 MG capsule    Sig: One cap PO qHS for 3 nights, then two caps qHS for 3 nights, then three cap qHS for 3 nights, stop at dose that resolves shooting arm symptoms    Dispense:  90 capsule    Refill:  0   cyclobenzaprine (FLEXERIL) 10 MG tablet    Sig: Take 1 tablet (10 mg total) by mouth at bedtime as needed for muscle spasms.    Dispense:  30 tablet    Refill:  0   Orders Placed This Encounter  Procedures   DG Cervical Spine Complete     Return in about 2 weeks (around 12/09/2020).     Alba Cory, MD   Primary Care Sports Medicine [facility]

## 2020-11-25 NOTE — Assessment & Plan Note (Signed)
Left-hand-dominant patient with roughly 55-month history of right paraspinal pain with radiation to the right fingertips involving paresthesias and weakness.  He is highly active at baseline with weightlifting and has noted decreased strength during this timeframe.  He does give history of deep tissue massage at onset, vague history of similar symptoms in the distant past to the contralateral side that resolved after physical therapy.  He has been dosing diclofenac 75mg  from his primary care provider Dr. and noting improvement without resolution.  Continues to attempt to remain highly active in the gym though with persistent weakness.  He does have an element of nighttime pain.  Physical examination reveals limited rightward torsion and lateral flexion, 5 -/5 with resisted right shoulder abduction and flexion/extension at the right elbow, contralateral 5/5, sensation appears symmetric bilaterally throughout the upper extremities, he does have positive Spurling's testing on the right, contralateral negative, there is paraspinal muscular spasm on the right in the right upper trapezius and levator scapular regions.  I have advised a transition to a 5-day course of prednisone 50 mg daily, following this he will restart diclofenac 75 mg as previously prescribed, knows nightly cyclobenzaprine x4 nights then as needed, initiate gradually titrating dose of gabapentin with a goal of remaining at lowest dose necessary to mitigate paresthesias.  I have advised him to refrain from upper body athletics/working out until medically cleared, we will obtain plain films of his cervical spine, and have him return for follow-up in 2 weeks.  For suboptimal progress, pending x-rays, advanced imaging, PT, pharmacotherapy management to be considered.  If improved, PT, wean from medications, and consider mild athletic activity increase.

## 2020-11-26 ENCOUNTER — Other Ambulatory Visit: Payer: Self-pay

## 2020-11-26 DIAGNOSIS — E1169 Type 2 diabetes mellitus with other specified complication: Secondary | ICD-10-CM

## 2020-11-26 MED ORDER — GVOKE HYPOPEN 1-PACK 1 MG/0.2ML ~~LOC~~ SOAJ: 1.0000 | Freq: Every day | SUBCUTANEOUS | 0 refills | Status: DC | PRN

## 2020-11-26 NOTE — Telephone Encounter (Signed)
Requested medications are on the active medication list yes  Last visit 11/19/20  Future visit scheduled 11/23/21  Notes to clinic Pharm requesting alternative due to not being covered by insurance, no protocol to assess.  Requested Prescriptions  Pending Prescriptions Disp Refills   GVOKE HYPOPEN 1-PACK 1 MG/0.2ML SOAJ [Pharmacy Med Name: GVOKE HYPOPEN 1-PK 1 MG/0.2 ML]  0    Sig: Inject 1 each into the skin daily as needed.     Off-Protocol Failed - 11/25/2020  4:40 PM      Failed - Medication not assigned to a protocol, review manually.      Passed - Valid encounter within last 12 months    Recent Outpatient Visits           Yesterday Right cervical radiculopathy   [facility] Jerrol Banana, MD   1 week ago Well adult exam   [facility] Alba Cory, MD   1 year ago Well adult exam   [facility] Alba Cory, MD   2 years ago Well adult exam   [facility] Alba Cory, MD   3 years ago Annual physical exam   [facility] Alba Cory, MD       Future Appointments             In 1 week Ashley Royalty, Ocie Bob, MD [facility]   In 10 months Stoioff, Verna Czech, MD [facility]   In 12 months Alba Cory, MD [facility]

## 2020-11-29 ENCOUNTER — Other Ambulatory Visit: Payer: Self-pay

## 2020-12-01 DIAGNOSIS — E1169 Type 2 diabetes mellitus with other specified complication: Secondary | ICD-10-CM | POA: Diagnosis not present

## 2020-12-01 DIAGNOSIS — E1165 Type 2 diabetes mellitus with hyperglycemia: Secondary | ICD-10-CM | POA: Diagnosis not present

## 2020-12-01 DIAGNOSIS — E785 Hyperlipidemia, unspecified: Secondary | ICD-10-CM | POA: Diagnosis not present

## 2020-12-01 DIAGNOSIS — I10 Essential (primary) hypertension: Secondary | ICD-10-CM | POA: Diagnosis not present

## 2020-12-01 DIAGNOSIS — E559 Vitamin D deficiency, unspecified: Secondary | ICD-10-CM | POA: Diagnosis not present

## 2020-12-01 DIAGNOSIS — Z794 Long term (current) use of insulin: Secondary | ICD-10-CM | POA: Diagnosis not present

## 2020-12-07 ENCOUNTER — Ambulatory Visit: Payer: BC Managed Care – PPO | Admitting: Family Medicine

## 2020-12-07 DIAGNOSIS — E1165 Type 2 diabetes mellitus with hyperglycemia: Secondary | ICD-10-CM | POA: Diagnosis not present

## 2020-12-07 DIAGNOSIS — E1122 Type 2 diabetes mellitus with diabetic chronic kidney disease: Secondary | ICD-10-CM | POA: Diagnosis not present

## 2020-12-07 DIAGNOSIS — E11649 Type 2 diabetes mellitus with hypoglycemia without coma: Secondary | ICD-10-CM | POA: Diagnosis not present

## 2020-12-07 DIAGNOSIS — I129 Hypertensive chronic kidney disease with stage 1 through stage 4 chronic kidney disease, or unspecified chronic kidney disease: Secondary | ICD-10-CM | POA: Diagnosis not present

## 2020-12-08 ENCOUNTER — Ambulatory Visit: Payer: BC Managed Care – PPO | Admitting: Family Medicine

## 2020-12-08 ENCOUNTER — Other Ambulatory Visit: Payer: Self-pay

## 2020-12-08 ENCOUNTER — Encounter: Payer: Self-pay | Admitting: Family Medicine

## 2020-12-08 VITALS — BP 132/68 | HR 80 | Ht 65.0 in | Wt 182.0 lb

## 2020-12-08 DIAGNOSIS — M5412 Radiculopathy, cervical region: Secondary | ICD-10-CM | POA: Diagnosis not present

## 2020-12-08 DIAGNOSIS — M4722 Other spondylosis with radiculopathy, cervical region: Secondary | ICD-10-CM | POA: Diagnosis not present

## 2020-12-08 DIAGNOSIS — R29898 Other symptoms and signs involving the musculoskeletal system: Secondary | ICD-10-CM | POA: Insufficient documentation

## 2020-12-08 MED ORDER — DICLOFENAC SODIUM 75 MG PO TBEC
75.0000 mg | DELAYED_RELEASE_TABLET | Freq: Two times a day (BID) | ORAL | 2 refills | Status: DC
Start: 2020-12-08 — End: 2021-01-19

## 2020-12-08 MED ORDER — GABAPENTIN 600 MG PO TABS: 600.0000 mg | ORAL_TABLET | Freq: Every day | ORAL | 2 refills | Status: DC

## 2020-12-08 NOTE — Patient Instructions (Addendum)
-   Continue twice daily diclofenac (take with food) - Start nightly gabapentin 600 mg - If tolerable, dose AM and midday gabapentin 600 mg - If intolerable due to drowsiness, dose 100-300 mg gabapentin in AM and midday - Start physical therapy - Referral coordinator will contact you for MRI scheduling - Continue to hold from upper body athletics - Return in 6 weeks, contact for any questions or change in symptoms  [facility] Physical Therapy:  [place]:  [phone]  [place]: [phone]

## 2020-12-08 NOTE — Addendum Note (Signed)
Addended by: Lennart Pall on: 12/08/2020 12:27 PM   Modules accepted: Orders

## 2020-12-08 NOTE — Progress Notes (Signed)
Primary Care / Sports Medicine Office Visit  Patient Information:  Patient ID: Jack Cardenas, male DOB: 1965/06/28 Age: 55 y.o. MRN: [id number]   Jack Cardenas is a pleasant 55 y.o. male presenting with the following:  Chief Complaint  Patient presents with   Right cervical radiculopathy    X-Ray 11/25/20; took prednisone with some relief and now taking diclofenac twice daily with minimal relief, did not take cyclobenzaprine; 2/10 pain    Review of Systems pertinent details above   Patient Active Problem List   Diagnosis Date Noted   Right arm weakness 12/08/2020   Cervical spondylosis with radiculopathy 12/08/2020   Cervical radiculitis 12/08/2020   Cervical paraspinal muscle spasm 11/25/2020   BPH without obstruction/lower urinary tract symptoms 09/26/2019   History of elevated PSA 09/24/2018   Dyslipidemia associated with type 2 diabetes mellitus (HCC)    Mixed dyslipidemia 03/03/2016   Hypertension, essential 03/03/2016   Vitamin D deficiency 03/03/2016   Special screening for malignant neoplasms, colon    Past Medical History:  Diagnosis Date   Diabetes mellitus without complication (HCC)    Hyperlipidemia    Hypertension    MVP (mitral valve prolapse)    Dx'd as teenager. No issues.  Followed by PCP.   Outpatient Encounter Medications as of 12/08/2020  Medication Sig   amLODipine (NORVASC) 5 MG tablet Take 1 tablet once a day for blood pressure   aspirin EC 81 MG tablet Take 1 tablet by mouth daily.   atorvastatin (LIPITOR) 40 MG tablet Take 1 tablet by mouth once a day   Blood Glucose Monitoring Suppl (GLUCOCOM BLOOD GLUCOSE MONITOR) DEVI Use to test blood sugars four times a day   Cholecalciferol (VITAMIN D3) 2000 units capsule Take 1 capsule once a day   ezetimibe (ZETIA) 10 MG tablet Take 1 tablet once a day for cholesterol   gabapentin (NEURONTIN) 100 MG capsule One cap PO qHS for 3 nights, then two caps qHS for 3 nights, then three cap qHS for 3 nights, stop  at dose that resolves shooting arm symptoms   gabapentin (NEURONTIN) 600 MG tablet Take 1 tablet (600 mg total) by mouth at bedtime. Two tabs PO TID   Glucagon (GVOKE HYPOPEN 1-PACK) 1 MG/0.2ML SOAJ Inject 1 each into the skin daily as needed.   insulin glargine (LANTUS) 100 UNIT/ML Solostar Pen Inject 15-24 Units into the skin at bedtime.   Insulin Pen Needle 32G X 4 MM MISC Use as directed for insulin injection once a day   Multiple Vitamin (MULTIVITAMIN) tablet Take 1 tablet by mouth daily.   nateglinide (STARLIX) 120 MG tablet Take 1 tablet by mouth daily before supper.   Omega-3 Fatty Acids (FISH OIL PO) Take 2 capsules by mouth daily.   ONETOUCH VERIO test strip 1 each by Other route as needed.   ramipril (ALTACE) 10 MG capsule Take 10 mg by mouth daily.   [DISCONTINUED] diclofenac (VOLTAREN) 75 MG EC tablet Take 1 tablet (75 mg total) by mouth 2 (two) times daily.   cyclobenzaprine (FLEXERIL) 10 MG tablet Take 1 tablet (10 mg total) by mouth at bedtime as needed for muscle spasms. (Patient not taking: Reported on 12/08/2020)   diclofenac (VOLTAREN) 75 MG EC tablet Take 1 tablet (75 mg total) by mouth 2 (two) times daily.   empagliflozin (JARDIANCE) 10 MG TABS tablet Take 1 tablet by mouth daily. (Patient not taking: Reported on 12/08/2020)   [DISCONTINUED] predniSONE (DELTASONE) 50 MG tablet Take 1 tablet (50  mg total) by mouth daily.   No facility-administered encounter medications on file as of 12/08/2020.   Past Surgical History:  Procedure Laterality Date   COLONOSCOPY WITH PROPOFOL N/A 12/11/2014   Procedure: COLONOSCOPY WITH PROPOFOL;  Surgeon: Midge Minium, MD;  Location: [facility];  Service: Endoscopy;  Laterality: N/A;  Diabetic - insulin    Vitals:   12/08/20 1105  BP: 132/68  Pulse: 80  SpO2: 98%   Vitals:   12/08/20 1105  Weight: 182 lb (82.6 kg)  Height: 5\' 5"  (1.651 m)   Body mass index is 30.29 kg/m.  DG Cervical Spine Complete  Result Date:  11/27/2020 CLINICAL DATA:  Neck pain with right-sided radiculopathy 3 months. EXAM: CERVICAL SPINE - COMPLETE 4+ VIEW COMPARISON:  None. FINDINGS: Subtle 2-3 mm posterior subluxation of C4 on C5 which is degenerative in nature. There is moderate spondylosis throughout the cervical spine. There is mild disc space narrowing from the C3-4 level to the C6-7 level. Prevertebral soft tissues are normal. There is moderate uncovertebral joint spurring and facet arthropathy. Atlantoaxial articulation is unremarkable. Severe left-sided neural foraminal narrowing from the C3-4 level to the C5-6 level. Minimal left-sided neural foraminal narrowing at the C6-7 and C7-T1 levels. Moderate right-sided neural foraminal narrowing at the C4-5 level with minimal right-sided neural from narrowing at the C6-7 level. IMPRESSION: 1. Moderate spondylosis throughout the cervical spine with mild multilevel disc disease from the C3-4 level to the C6-7 level. 2. Significant bilateral neural foraminal narrowing as described most prominent on the left from the C3-4 level to the C5-6 level. Electronically Signed   By: 11/29/2020 M.D.   On: 11/27/2020 11:33     Independent interpretation of notes and tests performed by another provider:   Independent interpretation of cervical spine x-ray dated 11/27/2020 reveals maintained lordotic alignment, multilevel significant intervertebral narrowing, anterior endplate osteophytes, and foraminal narrowing that is asymmetric in nature, no acute osseous processes noted.  Procedures performed:   None  Pertinent History, Exam, Impression, and Recommendations:   Cervical spondylosis with radiculopathy Ongoing symptomatology noted, interval improvement relate.  Objectively he still demonstrates positive bilateral Spurling's with manifestation of the right upper extremity.  Recent radiographs show multilevel cervical spondyloarthropathy and is radiculopathy is in the C3-4, C4-5, and C5-6  distributions on the right.  Strength appears to be symmetric, improved over interval visit.  He is tolerating diclofenac and gabapentin titration dose well.  Given his clinical and radiographic findings, persistent symptomatology, I have advised advanced imaging with MRI of the cervical spine without contrast.  He will start formal physical therapy, continue to refrain from upper body athletics, increase gabapentin to 600 mg nightly, will dose a.m. and midday gabapentin as well, dose diclofenac 75 mg twice daily, and we will coordinate a follow-up in 6 weeks for reevaluation.  Pending MRI results, plan modification to be considered with referral to pain/spine or neurosurgery.  If continued improvement noted, we can continue conservative management options.  Right arm weakness Interval improved. See additional assessment(s) for plan details.  Cervical radiculitis See additional assessment(s) for plan details.   Orders & Medications Meds ordered this encounter  Medications   gabapentin (NEURONTIN) 600 MG tablet    Sig: Take 1 tablet (600 mg total) by mouth at bedtime. Two tabs PO TID    Dispense:  60 tablet    Refill:  2   diclofenac (VOLTAREN) 75 MG EC tablet    Sig: Take 1 tablet (75 mg total) by mouth 2 (  two) times daily.    Dispense:  30 tablet    Refill:  2   Orders Placed This Encounter  Procedures   MR Cervical Spine Wo Contrast   Ambulatory referral to Physical Therapy     Return in about 6 weeks (around 01/19/2021).     Jerrol Banana, MD   Primary Care Sports Medicine [facility]

## 2020-12-08 NOTE — Assessment & Plan Note (Signed)
Ongoing symptomatology noted, interval improvement relate.  Objectively he still demonstrates positive bilateral Spurling's with manifestation of the right upper extremity.  Recent radiographs show multilevel cervical spondyloarthropathy and is radiculopathy is in the C3-4, C4-5, and C5-6 distributions on the right.  Strength appears to be symmetric, improved over interval visit.  He is tolerating diclofenac and gabapentin titration dose well.  Given his clinical and radiographic findings, persistent symptomatology, I have advised advanced imaging with MRI of the cervical spine without contrast.  He will start formal physical therapy, continue to refrain from upper body athletics, increase gabapentin to 600 mg nightly, will dose a.m. and midday gabapentin as well, dose diclofenac 75 mg twice daily, and we will coordinate a follow-up in 6 weeks for reevaluation.  Pending MRI results, plan modification to be considered with referral to pain/spine or neurosurgery.  If continued improvement noted, we can continue conservative management options.

## 2020-12-08 NOTE — Assessment & Plan Note (Signed)
Interval improved. See additional assessment(s) for plan details.

## 2020-12-08 NOTE — Assessment & Plan Note (Signed)
See additional assessment(s) for plan details. 

## 2020-12-12 ENCOUNTER — Other Ambulatory Visit: Payer: Self-pay

## 2020-12-12 ENCOUNTER — Ambulatory Visit
Admission: RE | Admit: 2020-12-12 | Discharge: 2020-12-12 | Disposition: A | Discharge status: Home / Self Care | Payer: BC Managed Care – PPO | Source: Ambulatory Visit | Attending: Family Medicine | Admitting: Family Medicine

## 2020-12-12 DIAGNOSIS — M50223 Other cervical disc displacement at C6-C7 level: Secondary | ICD-10-CM | POA: Diagnosis not present

## 2020-12-12 DIAGNOSIS — M50221 Other cervical disc displacement at C4-C5 level: Secondary | ICD-10-CM | POA: Diagnosis not present

## 2020-12-12 DIAGNOSIS — M4722 Other spondylosis with radiculopathy, cervical region: Secondary | ICD-10-CM | POA: Insufficient documentation

## 2020-12-12 DIAGNOSIS — M5412 Radiculopathy, cervical region: Secondary | ICD-10-CM | POA: Diagnosis not present

## 2020-12-12 DIAGNOSIS — M47812 Spondylosis without myelopathy or radiculopathy, cervical region: Secondary | ICD-10-CM | POA: Diagnosis not present

## 2020-12-12 DIAGNOSIS — R29898 Other symptoms and signs involving the musculoskeletal system: Secondary | ICD-10-CM | POA: Diagnosis not present

## 2020-12-12 DIAGNOSIS — M4802 Spinal stenosis, cervical region: Secondary | ICD-10-CM | POA: Diagnosis not present

## 2020-12-13 ENCOUNTER — Telehealth: Payer: Self-pay

## 2020-12-13 DIAGNOSIS — G542 Cervical root disorders, not elsewhere classified: Secondary | ICD-10-CM

## 2020-12-13 NOTE — Telephone Encounter (Signed)
-----   Message from Jerrol Banana, MD sent at 12/13/2020 12:02 PM EST ----- Reviewed MRI results with patient on phone today. Need for further evaluation by Spine Surgeon reviewed and he is amenable to this. I have also advised continued refrain from upper body athletics, gentle non-strenuous ROM okay, and to continue medica tions.  #Jack Cardenas, please place Urgent referral to Spine Surgeon for further evaluation of C3-4 spinal cord impingement and right upper extremity weakness. Thank you

## 2020-12-13 NOTE — Telephone Encounter (Signed)
Urgent referral placed to Neurosurgery with Dr. Myer Haff at [facility].  For your information.

## 2020-12-13 NOTE — Progress Notes (Signed)
Reviewed MRI results with patient on phone today. Need for further evaluation by Spine Surgeon reviewed and he is amenable to this. I have also advised continued refrain from upper body athletics, gentle non-strenuous ROM okay, and to continue medications.  *Britny, please place Urgent referral to Spine Surgeon for further evaluation of C3-4 spinal cord impingement and right upper extremity weakness. Thank you

## 2021-01-06 DIAGNOSIS — M5412 Radiculopathy, cervical region: Secondary | ICD-10-CM | POA: Diagnosis not present

## 2021-01-19 ENCOUNTER — Ambulatory Visit: Payer: BC Managed Care – PPO | Admitting: Family Medicine

## 2021-01-19 ENCOUNTER — Other Ambulatory Visit: Payer: Self-pay

## 2021-01-19 ENCOUNTER — Encounter: Payer: Self-pay | Admitting: Family Medicine

## 2021-01-19 DIAGNOSIS — M5412 Radiculopathy, cervical region: Secondary | ICD-10-CM | POA: Diagnosis not present

## 2021-01-19 DIAGNOSIS — M4722 Other spondylosis with radiculopathy, cervical region: Secondary | ICD-10-CM | POA: Diagnosis not present

## 2021-01-19 DIAGNOSIS — R29898 Other symptoms and signs involving the musculoskeletal system: Secondary | ICD-10-CM | POA: Diagnosis not present

## 2021-01-19 MED ORDER — GABAPENTIN 600 MG PO TABS: 600.0000 mg | ORAL_TABLET | Freq: Every day | ORAL | 2 refills | Status: DC

## 2021-01-19 MED ORDER — DICLOFENAC SODIUM 75 MG PO TBEC: 75.0000 mg | DELAYED_RELEASE_TABLET | Freq: Two times a day (BID) | ORAL | 2 refills | Status: DC

## 2021-01-19 NOTE — Progress Notes (Signed)
Primary Care / Sports Medicine Office Visit  Patient Information:  Patient ID: Jack Cardenas, male DOB: [birth year] Age: 55 y.o. MRN: [id number]   Jack Cardenas is a pleasant 55 y.o. male presenting with the following:  Chief Complaint  Patient presents with   Follow-up   Cervical spondylosis with radiculopathy    Following with Dr. Myer Haff (01/06/21); will start PT 01/26/21; still working out, but using lighter weights; MRI 12/12/20; denies pain in office    Patient Active Problem List   Diagnosis Date Noted   Right arm weakness 12/08/2020   Cervical spondylosis with radiculopathy 12/08/2020   Cervical radiculitis 12/08/2020   Cervical paraspinal muscle spasm 11/25/2020   BPH without obstruction/lower urinary tract symptoms 09/26/2019   History of elevated PSA 09/24/2018   Dyslipidemia associated with type 2 diabetes mellitus (HCC)    Mixed dyslipidemia 03/03/2016   Hypertension, essential 03/03/2016   Vitamin D deficiency 03/03/2016   Hypertension associated with stage 2 chronic kidney disease due to type 2 diabetes mellitus (HCC) 03/03/2016   Special screening for malignant neoplasms, colon     Vitals:   01/19/21 0959  BP: 138/78  Pulse: 80  SpO2: 98%   Vitals:   01/19/21 0959  Weight: 183 lb (83 kg)  Height: 5\' 5"  (1.651 m)   Body mass index is 30.45 kg/m.  No results found.   Independent interpretation of notes and tests performed by another provider:   None  Procedures performed:   None  Pertinent History, Exam, Impression, and Recommendations:   Cervical spondylosis with radiculopathy Patient with chronic symptomatology that is stable per patient report.  He has recently seen neurosurgeon who has advised possible cervical spine surgical interventions at 1 of 2 levels.  To better determine this nerve conduction studies and physical therapy have been ordered.  Patient has PT visit scheduled as well as NCS.  He was also cleared for return to weight  training in the gym.  He reports excellent symptom control with medication regimen, was able to titrate gabapentin to 600 mg nightly which adequately controls his symptoms, diclofenac twice daily does as well.  Medication management performed and we have decided to have patient remain on 600 mg as opposed to further titration with previous goal of 1200, additionally diclofenac twice daily is to be continued, at least until his follow-up with neurosurgery.  I have advised symptom-based activity while in the gym, avoiding excessive spinal extension, and that he can follow-up on an as-needed basis as we will defer to neurosurgery at this stage.  He was advised to contact for any questions or refills.  Cervical radiculitis Chronic condition that is stable, he has noted only infrequent radiation of symptoms into the upper extremities, he attributes this to current medication regimen.  Plan to avoid further titration and remain at current course, new prescription was provided today.  Right arm weakness Chronic condition, patient is continuing to note right arm weakness, has been stable, as he starts physical therapy and return to the gym, hopes for strength to be regained, this can be further clarified by upcoming nerve conduction studies and optimized by physical therapy.   Orders & Medications Meds ordered this encounter  Medications   gabapentin (NEURONTIN) 600 MG tablet    Sig: Take 1 tablet (600 mg total) by mouth at bedtime.    Dispense:  30 tablet    Refill:  2   diclofenac (VOLTAREN) 75 MG EC tablet    Sig: Take 1  tablet (75 mg total) by mouth 2 (two) times daily.    Dispense:  60 tablet    Refill:  2   No orders of the defined types were placed in this encounter.    No follow-ups on file.     Jerrol Banana, MD   Primary Care Sports Medicine [facility]

## 2021-01-19 NOTE — Assessment & Plan Note (Signed)
Patient with chronic symptomatology that is stable per patient report.  He has recently seen neurosurgeon who has advised possible cervical spine surgical interventions at 1 of 2 levels.  To better determine this nerve conduction studies and physical therapy have been ordered.  Patient has PT visit scheduled as well as NCS.  He was also cleared for return to weight training in the gym.  He reports excellent symptom control with medication regimen, was able to titrate gabapentin to 600 mg nightly which adequately controls his symptoms, diclofenac twice daily does as well.  Medication management performed and we have decided to have patient remain on 600 mg as opposed to further titration with previous goal of 1200, additionally diclofenac twice daily is to be continued, at least until his follow-up with neurosurgery.  I have advised symptom-based activity while in the gym, avoiding excessive spinal extension, and that he can follow-up on an as-needed basis as we will defer to neurosurgery at this stage.  He was advised to contact us for any questions or refills.

## 2021-01-19 NOTE — Assessment & Plan Note (Signed)
Chronic condition, patient is continuing to note right arm weakness, has been stable, as he starts physical therapy and return to the gym, hopes for strength to be regained, this can be further clarified by upcoming nerve conduction studies and optimized by physical therapy.

## 2021-01-19 NOTE — Assessment & Plan Note (Signed)
Chronic condition that is stable, he has noted only infrequent radiation of symptoms into the upper extremities, he attributes this to current medication regimen.  Plan to avoid further titration and remain at current course, new prescription was provided today.

## 2021-01-26 DIAGNOSIS — M5412 Radiculopathy, cervical region: Secondary | ICD-10-CM | POA: Diagnosis not present

## 2021-02-02 DIAGNOSIS — M5412 Radiculopathy, cervical region: Secondary | ICD-10-CM | POA: Diagnosis not present

## 2021-02-09 DIAGNOSIS — M5412 Radiculopathy, cervical region: Secondary | ICD-10-CM | POA: Diagnosis not present

## 2021-02-17 DIAGNOSIS — M5412 Radiculopathy, cervical region: Secondary | ICD-10-CM | POA: Diagnosis not present

## 2021-03-03 DIAGNOSIS — M5412 Radiculopathy, cervical region: Secondary | ICD-10-CM | POA: Diagnosis not present

## 2021-03-09 DIAGNOSIS — E1165 Type 2 diabetes mellitus with hyperglycemia: Secondary | ICD-10-CM | POA: Diagnosis not present

## 2021-03-09 DIAGNOSIS — Z794 Long term (current) use of insulin: Secondary | ICD-10-CM | POA: Diagnosis not present

## 2021-03-09 DIAGNOSIS — E785 Hyperlipidemia, unspecified: Secondary | ICD-10-CM | POA: Diagnosis not present

## 2021-03-09 DIAGNOSIS — E1169 Type 2 diabetes mellitus with other specified complication: Secondary | ICD-10-CM | POA: Diagnosis not present

## 2021-03-11 DIAGNOSIS — E1165 Type 2 diabetes mellitus with hyperglycemia: Secondary | ICD-10-CM | POA: Diagnosis not present

## 2021-03-11 DIAGNOSIS — E1122 Type 2 diabetes mellitus with diabetic chronic kidney disease: Secondary | ICD-10-CM | POA: Diagnosis not present

## 2021-03-11 DIAGNOSIS — E11649 Type 2 diabetes mellitus with hypoglycemia without coma: Secondary | ICD-10-CM | POA: Diagnosis not present

## 2021-03-11 DIAGNOSIS — R748 Abnormal levels of other serum enzymes: Secondary | ICD-10-CM | POA: Diagnosis not present

## 2021-03-23 DIAGNOSIS — M5412 Radiculopathy, cervical region: Secondary | ICD-10-CM | POA: Diagnosis not present

## 2021-04-06 DIAGNOSIS — M5412 Radiculopathy, cervical region: Secondary | ICD-10-CM | POA: Diagnosis not present

## 2021-04-08 DIAGNOSIS — R748 Abnormal levels of other serum enzymes: Secondary | ICD-10-CM | POA: Diagnosis not present

## 2021-04-18 ENCOUNTER — Other Ambulatory Visit: Payer: Self-pay | Admitting: Family Medicine

## 2021-04-18 DIAGNOSIS — M5412 Radiculopathy, cervical region: Secondary | ICD-10-CM

## 2021-04-18 DIAGNOSIS — R29898 Other symptoms and signs involving the musculoskeletal system: Secondary | ICD-10-CM

## 2021-04-18 DIAGNOSIS — M4722 Other spondylosis with radiculopathy, cervical region: Secondary | ICD-10-CM

## 2021-04-19 NOTE — Telephone Encounter (Signed)
Requested medication (s) are due for refill today: yes ? ?Requested medication (s) are on the active medication list: yes ? ?Last refill:  01/19/21 #60 with 2 RF ? ?Future visit scheduled: 11/23/21 ? ?Notes to clinic: Prescription from other provider, labs are from 2020 therefore it failed protocol, please assess. ? ? ?  ? ?Requested Prescriptions  ?Pending Prescriptions Disp Refills  ? diclofenac (VOLTAREN) 75 MG EC tablet [Pharmacy Med Name: DICLOFENAC SOD EC 75 MG TAB] 60 tablet 2  ?  Sig: TAKE 1 TABLET BY MOUTH TWICE A DAY  ?  ? Analgesics:  NSAIDS Failed - 04/18/2021  1:48 AM  ?  ?  Failed - Manual Review: Labs are only required if the patient has taken medication for more than 8 weeks.  ?  ?  Failed - Cr in normal range and within 360 days  ?  Creatinine  ?Date Value Ref Range Status  ?09/17/2018 1.2 0.6 - 1.3 Final  ? ?Creat  ?Date Value Ref Range Status  ?10/27/2016 1.30 0.70 - 1.33 mg/dL Final  ?  Comment:  ?  For patients >94 years of age, the reference limit ?for Creatinine is approximately 13% higher for people ?identified as African-American. ?. ?  ?  ?  ?  ?  Failed - HGB in normal range and within 360 days  ?  Hemoglobin  ?Date Value Ref Range Status  ?11/04/2018 14.8 13.2 - 17.1 g/dL Final  ?10/14/2014 15.0 12.6 - 17.7 g/dL Final  ?  ?  ?  ?  Failed - PLT in normal range and within 360 days  ?  Platelets  ?Date Value Ref Range Status  ?11/04/2018 185 140 - 400 Thousand/uL Final  ?10/14/2014 176 150 - 379 x10E3/uL Final  ?  ?  ?  ?  Failed - HCT in normal range and within 360 days  ?  HCT  ?Date Value Ref Range Status  ?11/04/2018 46.1 38.5 - 50.0 % Final  ? ?Hematocrit  ?Date Value Ref Range Status  ?10/14/2014 45.9 37.5 - 51.0 % Final  ?  ?  ?  ?  Failed - eGFR is 30 or above and within 360 days  ?  GFR, Est African American  ?Date Value Ref Range Status  ?10/27/2016 73 > OR = 60 mL/min/1.54m Final  ? ?GFR, Est Non African American  ?Date Value Ref Range Status  ?10/27/2016 63 > OR = 60  mL/min/1.743mFinal  ?  ?  ?  ?  Passed - Patient is not pregnant  ?  ?  Passed - Valid encounter within last 12 months  ?  Recent Outpatient Visits   ? ?      ? 3 months ago [facility]  ? 4 months [facility]  ? 4 months [facility]  ? 5 months [facility]  ? 1 year [facility]  [facility]  ? ?  ?  ?Future Appointments   ? ?        ? In 5 months Stoioff, [facility] Fairly[facility]? In 7 months [facility]? ?  ? ?  ?  ?  ? ? ?

## 2021-04-20 DIAGNOSIS — M5412 Radiculopathy, cervical region: Secondary | ICD-10-CM | POA: Diagnosis not present

## 2021-06-08 DIAGNOSIS — Z794 Long term (current) use of insulin: Secondary | ICD-10-CM | POA: Diagnosis not present

## 2021-06-08 DIAGNOSIS — E782 Mixed hyperlipidemia: Secondary | ICD-10-CM | POA: Diagnosis not present

## 2021-06-08 DIAGNOSIS — I10 Essential (primary) hypertension: Secondary | ICD-10-CM | POA: Diagnosis not present

## 2021-06-08 DIAGNOSIS — E1165 Type 2 diabetes mellitus with hyperglycemia: Secondary | ICD-10-CM | POA: Diagnosis not present

## 2021-06-10 DIAGNOSIS — E11649 Type 2 diabetes mellitus with hypoglycemia without coma: Secondary | ICD-10-CM | POA: Diagnosis not present

## 2021-06-10 DIAGNOSIS — E1165 Type 2 diabetes mellitus with hyperglycemia: Secondary | ICD-10-CM | POA: Diagnosis not present

## 2021-06-10 DIAGNOSIS — E1122 Type 2 diabetes mellitus with diabetic chronic kidney disease: Secondary | ICD-10-CM | POA: Diagnosis not present

## 2021-06-10 DIAGNOSIS — I129 Hypertensive chronic kidney disease with stage 1 through stage 4 chronic kidney disease, or unspecified chronic kidney disease: Secondary | ICD-10-CM | POA: Diagnosis not present

## 2021-07-19 DIAGNOSIS — H524 Presbyopia: Secondary | ICD-10-CM | POA: Diagnosis not present

## 2021-07-19 DIAGNOSIS — H04123 Dry eye syndrome of bilateral lacrimal glands: Secondary | ICD-10-CM | POA: Diagnosis not present

## 2021-07-19 DIAGNOSIS — E113292 Type 2 diabetes mellitus with mild nonproliferative diabetic retinopathy without macular edema, left eye: Secondary | ICD-10-CM | POA: Diagnosis not present

## 2021-07-19 LAB — HM DIABETES EYE EXAM

## 2021-08-17 ENCOUNTER — Other Ambulatory Visit: Payer: Self-pay

## 2021-08-17 ENCOUNTER — Other Ambulatory Visit: Payer: Self-pay | Admitting: Family Medicine

## 2021-08-17 DIAGNOSIS — M4722 Other spondylosis with radiculopathy, cervical region: Secondary | ICD-10-CM

## 2021-08-17 DIAGNOSIS — M5412 Radiculopathy, cervical region: Secondary | ICD-10-CM

## 2021-08-17 DIAGNOSIS — R29898 Other symptoms and signs involving the musculoskeletal system: Secondary | ICD-10-CM

## 2021-09-23 ENCOUNTER — Other Ambulatory Visit: Payer: Self-pay

## 2021-09-23 DIAGNOSIS — N400 Enlarged prostate without lower urinary tract symptoms: Secondary | ICD-10-CM

## 2021-09-23 DIAGNOSIS — Z87898 Personal history of other specified conditions: Secondary | ICD-10-CM

## 2021-09-26 ENCOUNTER — Other Ambulatory Visit: Payer: BC Managed Care – PPO

## 2021-09-26 DIAGNOSIS — Z87898 Personal history of other specified conditions: Secondary | ICD-10-CM | POA: Diagnosis not present

## 2021-09-26 DIAGNOSIS — N400 Enlarged prostate without lower urinary tract symptoms: Secondary | ICD-10-CM | POA: Diagnosis not present

## 2021-09-27 LAB — PSA: Prostate Specific Ag, Serum: 3.5 ng/mL (ref 0.0–4.0)

## 2021-09-29 ENCOUNTER — Ambulatory Visit: Payer: BC Managed Care – PPO | Admitting: Urology

## 2021-09-29 ENCOUNTER — Encounter: Payer: Self-pay | Admitting: Urology

## 2021-09-29 VITALS — BP 132/75 | HR 67 | Ht 65.0 in | Wt 175.0 lb

## 2021-09-29 DIAGNOSIS — R972 Elevated prostate specific antigen [PSA]: Secondary | ICD-10-CM

## 2021-09-29 DIAGNOSIS — N400 Enlarged prostate without lower urinary tract symptoms: Secondary | ICD-10-CM | POA: Diagnosis not present

## 2021-09-29 DIAGNOSIS — Z87898 Personal history of other specified conditions: Secondary | ICD-10-CM

## 2021-09-29 NOTE — Progress Notes (Signed)
09/29/2021 8:02 AM   Jack Cardenas Nov 09, 1965 [id number]  Referring provider: Alba Cory, MD [address] [place],  [place] [address]  Chief Complaint  Patient presents with   Elevated PSA     Urologic history: 1.  Elevated PSA -Prostate biopsy 12/2016; PSA 5.5; volume 33 g; benign pathology  HPI: 56 y.o. male presents for annual follow-up.  Doing well since last visit No bothersome LUTS Denies dysuria, gross hematuria Denies flank, abdominal or pelvic pain PSA 09/26/2021 stable at 3.5   PMH: Past Medical History:  Diagnosis Date   Diabetes mellitus without complication (HCC)    Hyperlipidemia    Hypertension    MVP (mitral valve prolapse)    Dx'd as teenager. No issues.  Followed by PCP.    Surgical History: Past Surgical History:  Procedure Laterality Date   COLONOSCOPY WITH PROPOFOL N/A 12/11/2014   Procedure: COLONOSCOPY WITH PROPOFOL;  Surgeon: Midge Minium, MD;  [facility];  Service: Endoscopy;  Laterality: N/A;  Diabetic - insulin    Home Medications:  Allergies as of 09/29/2021   No Known Allergies      Medication List        Accurate as of September 29, 2021  8:02 AM. If you have any questions, ask your nurse or doctor.          amLODipine 5 MG tablet Commonly known as: NORVASC Take 5 mg by mouth daily.   aspirin EC 81 MG tablet Take 81 mg by mouth daily.   atorvastatin 40 MG tablet Commonly known as: LIPITOR Take 40 mg by mouth daily.   Cholecalciferol 50 MCG (2000 UT) Caps Take 2,000 Units by mouth daily.   diclofenac 75 MG EC tablet Commonly known as: VOLTAREN TAKE 1 TABLET BY MOUTH TWICE A DAY   empagliflozin 10 MG Tabs tablet Commonly known as: JARDIANCE Take 1 tablet by mouth daily.   ezetimibe 10 MG tablet Commonly known as: ZETIA Take 1 tablet once a day for cholesterol   FISH OIL PO Take 2 capsules by mouth daily.   gabapentin 600 MG tablet Commonly known as: NEURONTIN Take 1  tablet (600 mg total) by mouth at bedtime.   GlucoCom Blood Glucose Monitor Devi Use to test blood sugars four times a day   Gvoke HypoPen 1-Pack 1 MG/0.2ML Soaj Generic drug: Glucagon Inject 1 each into the skin daily as needed.   insulin glargine 100 UNIT/ML Solostar Pen Commonly known as: LANTUS Inject 15-24 Units into the skin at bedtime.   Insulin Pen Needle 32G X 4 MM Misc Use as directed for insulin injection once a day   multivitamin tablet Take 1 tablet by mouth daily.   nateglinide 120 MG tablet Commonly known as: STARLIX Take 1 tablet by mouth daily before supper.   OneTouch Verio test strip Generic drug: glucose blood 1 each by Other route as needed.   ramipril 10 MG capsule Commonly known as: ALTACE Take 10 mg by mouth daily.        Allergies: No Known Allergies  Family History: Family History  Problem Relation Age of Onset   Renal Disease Father    Diabetes Father    Thyroid disease Sister    Metabolic syndrome Sister    Eczema Sister    Prostate cancer Paternal Grandfather    Colon cancer Paternal Grandfather     Social History:  reports that he has never smoked. He has never used smokeless tobacco. He reports that he does  not currently use alcohol. He reports that he does not use drugs.   Physical Exam: BP 132/75   Pulse 67   Ht 5\' 5"  (1.651 m)   Wt 175 lb (79.4 kg)   BMI 29.12 kg/m   Constitutional:  Alert and oriented, No acute distress. HEENT: [place] AT Respiratory: Normal respiratory effort, no increased work of breathing. GU: Prostate 40 g, smooth without nodules Neurologic: Grossly intact, no focal deficits, moving all 4 extremities. Psychiatric: Normal mood and affect.   Assessment & Plan:    1.  History elevated PSA Stable PSA with benign DRE Continue annual follow-up   2. BPH without LUTS   , MD  [facility] [address], [address] [place], [place] [phone]

## 2021-09-30 ENCOUNTER — Ambulatory Visit: Payer: BLUE CROSS/BLUE SHIELD | Admitting: Urology

## 2021-10-13 DIAGNOSIS — E559 Vitamin D deficiency, unspecified: Secondary | ICD-10-CM | POA: Diagnosis not present

## 2021-10-13 DIAGNOSIS — E1165 Type 2 diabetes mellitus with hyperglycemia: Secondary | ICD-10-CM | POA: Diagnosis not present

## 2021-10-13 DIAGNOSIS — E1169 Type 2 diabetes mellitus with other specified complication: Secondary | ICD-10-CM | POA: Diagnosis not present

## 2021-10-13 DIAGNOSIS — E1122 Type 2 diabetes mellitus with diabetic chronic kidney disease: Secondary | ICD-10-CM | POA: Diagnosis not present

## 2021-10-13 DIAGNOSIS — R748 Abnormal levels of other serum enzymes: Secondary | ICD-10-CM | POA: Diagnosis not present

## 2021-10-13 DIAGNOSIS — I129 Hypertensive chronic kidney disease with stage 1 through stage 4 chronic kidney disease, or unspecified chronic kidney disease: Secondary | ICD-10-CM | POA: Diagnosis not present

## 2021-10-13 DIAGNOSIS — N182 Chronic kidney disease, stage 2 (mild): Secondary | ICD-10-CM | POA: Diagnosis not present

## 2021-10-13 DIAGNOSIS — E669 Obesity, unspecified: Secondary | ICD-10-CM | POA: Diagnosis not present

## 2021-10-13 DIAGNOSIS — E785 Hyperlipidemia, unspecified: Secondary | ICD-10-CM | POA: Diagnosis not present

## 2021-10-13 DIAGNOSIS — Z794 Long term (current) use of insulin: Secondary | ICD-10-CM | POA: Diagnosis not present

## 2021-10-13 LAB — HEPATIC FUNCTION PANEL
ALT: 118 U/L — AB (ref 10–40)
AST: 63 — AB (ref 14–40)
Alkaline Phosphatase: 69 (ref 25–125)
Bilirubin, Direct: 0.2
Bilirubin, Total: 0.9

## 2021-10-13 LAB — BASIC METABOLIC PANEL
BUN: 17 (ref 4–21)
CO2: 29 — AB (ref 13–22)
Chloride: 104 (ref 99–108)
Creatinine: 1.3 (ref <=1.3)
Glucose: 78
Potassium: 3.8 mEq/L (ref 3.5–5.1)
Sodium: 141 (ref 137–147)

## 2021-10-13 LAB — LIPID PANEL
Cholesterol: 120 (ref 0–200)
HDL: 51 (ref 35–70)
LDL Cholesterol: 59
Triglycerides: 49 (ref 40–160)

## 2021-10-13 LAB — COMPREHENSIVE METABOLIC PANEL
Albumin: 4.2 (ref 3.5–5.0)
Calcium: 9.1 (ref 8.7–10.7)
eGFR: 65

## 2021-10-13 LAB — VITAMIN D 25 HYDROXY (VIT D DEFICIENCY, FRACTURES): Vit D, 25-Hydroxy: >120

## 2021-10-13 LAB — TSH: TSH: 1.85 (ref <=5.90)

## 2021-10-13 LAB — HEMOGLOBIN A1C: Hemoglobin A1C: 7.2

## 2021-10-14 DIAGNOSIS — E1122 Type 2 diabetes mellitus with diabetic chronic kidney disease: Secondary | ICD-10-CM | POA: Diagnosis not present

## 2021-10-14 DIAGNOSIS — N182 Chronic kidney disease, stage 2 (mild): Secondary | ICD-10-CM | POA: Diagnosis not present

## 2021-10-14 DIAGNOSIS — I129 Hypertensive chronic kidney disease with stage 1 through stage 4 chronic kidney disease, or unspecified chronic kidney disease: Secondary | ICD-10-CM | POA: Diagnosis not present

## 2021-10-14 DIAGNOSIS — E1165 Type 2 diabetes mellitus with hyperglycemia: Secondary | ICD-10-CM | POA: Diagnosis not present

## 2021-11-22 NOTE — Progress Notes (Unsigned)
Name: Jack Cardenas   MRN: [id number]    DOB: April 30, 1965   Date:11/23/2021       Progress Note  Subjective  Chief Complaint  Annual Exam  HPI  Patient presents for annual CPE.  IPSS Questionnaire (AUA-7): Over the past month.   1)  How often have you had a sensation of not emptying your bladder completely after you finish urinating?  0 - Not at all  2)  How often have you had to urinate again less than two hours after you finished urinating? 0 - Not at all  3)  How often have you found you stopped and started again several times when you urinated?  0 - Not at all  4) How difficult have you found it to postpone urination?  0 - Not at all  5) How often have you had a weak urinary stream?  0 - Not at all  6) How often have you had to push or strain to begin urination?  0 - Not at all  7) How many times did you most typically get up to urinate from the time you went to bed until the time you got up in the morning?  1 - 1 time  Total score:  0-7 mildly symptomatic   8-19 moderately symptomatic   20-35 severely symptomatic     Diet: healthy diet  Exercise: continue regular physical activity  Last Dental Exam: twice a year  Last Eye Exam: discussed yearly follow up  Depression: phq 9 is negative    11/23/2021    8:21 AM 11/23/2021    8:20 AM 01/19/2021   10:11 AM 12/08/2020   10:56 AM 11/25/2020   11:23 AM  Depression screen PHQ 2/9  Decreased Interest 0 0 0 0 0  Down, Depressed, Hopeless 0 0 0 0 0  PHQ - 2 Score 0 0 0 0 0  Altered sleeping 0  0 0 0  Tired, decreased energy 0  0 0 0  Change in appetite 0  0 0 0  Feeling bad or failure about yourself  0  0 0 0  Trouble concentrating 0  0 0 0  Moving slowly or fidgety/restless 0  0 0 0  Suicidal thoughts 0  0 0 0  PHQ-9 Score 0  0 0 0  Difficult doing work/chores   Not difficult at all Not difficult at all Not difficult at all    Hypertension:  BP Readings from Last 3 Encounters:  11/23/21 128/74  09/29/21 132/75   01/19/21 138/78    Obesity: Wt Readings from Last 3 Encounters:  11/23/21 168 lb 3.2 oz (76.3 kg)  09/29/21 175 lb (79.4 kg)  01/19/21 183 lb (83 kg)   BMI Readings from Last 3 Encounters:  11/23/21 27.99 kg/m  09/29/21 29.12 kg/m  01/19/21 30.45 kg/m     Lipids:  Lab Results  Component Value Date   CHOL 120 10/13/2021   CHOL 118 08/30/2020   CHOL 116 09/17/2018   Lab Results  Component Value Date   HDL 51 10/13/2021   HDL 49 08/30/2020   HDL 40 09/17/2018   Lab Results  Component Value Date   LDLCALC 59 10/13/2021   LDLCALC 57 08/30/2020   LDLCALC 62 09/17/2018   Lab Results  Component Value Date   TRIG 49 10/13/2021   TRIG 34 (A) 08/30/2020   TRIG 51 09/17/2018   No results found for: "CHOLHDL" No results found for: "LDLDIRECT" Glucose:  Glucose  Date Value  Ref Range Status  10/14/2014 100 (H) 65 - 99 mg/dL Final   Glucose, Bld  Date Value Ref Range Status  10/27/2016 59 (L) 65 - 99 mg/dL Final    Comment:    .            Fasting reference interval .    Glucose-Capillary  Date Value Ref Range Status  12/11/2014 88 65 - 99 mg/dL Final    Flowsheet Row Office Visit from 12/08/2020 in [facility]  AUDIT-C Score 0      Married STD testing and prevention (HIV/chl/gon/syphilis): N/A Sexual history: no problems  Hep C Screening: 11/02/17 Skin cancer: Discussed monitoring for atypical lesions Colorectal cancer: 12/11/14 Prostate cancer:   Lab Results  Component Value Date   PSA 4.1 (H) 10/27/2016     Lung cancer:  Low Dose CT Chest recommended if Age 48-80 years, 30 pack-year currently smoking OR have quit w/in 15years. Patient  no a candidate for screening   AAA: The USPSTF recommends one-time screening with ultrasonography in men ages 39 to 50 years who have ever smoked. Patient   no, a candidate for screening  ECG:  10/27/15  Vaccines:   HPV: N/A Tdap: up to date Shingrix: 1 of  2, 11/19/20 getting second dose today  Pneumonia: N/A Flu: today  COVID-19: N/A  Advanced Care Planning: A voluntary discussion about advance care planning including the explanation and discussion of advance directives.  Discussed health care proxy and Living will, and the patient was able to identify a health care proxy as wife .  Patient has a living will and power of attorney of health care   Patient Active Problem List   Diagnosis Date Noted   Right arm weakness 12/08/2020   Cervical spondylosis with radiculopathy 12/08/2020   Cervical radiculitis 12/08/2020   BPH without obstruction/lower urinary tract symptoms 09/26/2019   History of elevated PSA 09/24/2018   Dyslipidemia associated with type 2 diabetes mellitus ([place])    Mixed dyslipidemia 03/03/2016   Hypertension, essential 03/03/2016   Vitamin D deficiency 03/03/2016   Hypertension associated with stage 2 chronic kidney disease due to type 2 diabetes mellitus ([place]) 03/03/2016    Past Surgical History:  Procedure Laterality Date   COLONOSCOPY WITH PROPOFOL N/A 12/11/2014   Procedure: COLONOSCOPY WITH PROPOFOL;  Surgeon: Lucilla Lame, MD;  Location: [facility];  Service: Endoscopy;  Laterality: N/A;  Diabetic - insulin    Family History  Problem Relation Age of Onset   Renal Disease Father    Diabetes Father    Thyroid disease Sister    Metabolic syndrome Sister    Eczema Sister    Prostate cancer Paternal Grandfather    Colon cancer Paternal Grandfather     Social History   Socioeconomic History   Marital status: Married    Spouse name: Devery Murgia   Number of children: 2   Years of education: 16   Highest education level: Bachelor's degree (e.g., BA, AB, BS)  Occupational History   Occupation: estimator     Comment: [employer]   Tobacco Use   Smoking status: Never   Smokeless tobacco: Never  Vaping Use   Vaping Use: Never used  Substance and Sexual Activity   Alcohol use: Not  Currently   Drug use: Never   Sexual activity: Yes    Partners: Female  Other Topics Concern   Not on file  Social History Narrative   Not  on file   Social Determinants of Health   Financial Resource Strain: Low Risk  (11/23/2021)   Overall Financial Resource Strain (CARDIA)    Difficulty of Paying Living Expenses: Not hard at all  Food Insecurity: No Food Insecurity (11/23/2021)   Hunger Vital Sign    Worried About Running Out of Food in the Last Year: Never true    Ran Out of Food in the Last Year: Never true  Transportation Needs: No Transportation Needs (11/23/2021)   PRAPARE - Hydrologist (Medical): No    Lack of Transportation (Non-Medical): No  Physical Activity: Sufficiently Active (11/23/2021)   Exercise Vital Sign    Days of Exercise per Week: 6 days    Minutes of Exercise per Session: 70 min  Stress: No Stress Concern Present (11/23/2021)   [place]    Feeling of Stress : Not at all  Social Connections: [place] (11/23/2021)   Social Connection and Isolation Panel [NHANES]    Frequency of Communication with Friends and Family: More than three times a week    Frequency of Social Gatherings with Friends and Family: More than three times a week    Attends Religious Services: More than 4 times per year    Active Member of [employer] or Organizations: Yes    Attends Music therapist: More than 4 times per year    Marital Status: Married  Human resources officer Violence: Not At Risk (11/23/2021)   Humiliation, Afraid, Rape, and Kick questionnaire    Fear of Current or Ex-Partner: No    Emotionally Abused: No    Physically Abused: No    Sexually Abused: No     Current Outpatient Medications:    amLODipine (NORVASC) 5 MG tablet, Take 5 mg by mouth daily., Disp: , Rfl:    aspirin 81 MG EC tablet, Take 81 mg by mouth daily., Disp: , Rfl:    atorvastatin  (LIPITOR) 40 MG tablet, Take 40 mg by mouth daily., Disp: , Rfl:    Blood Glucose Monitoring Suppl (GLUCOCOM BLOOD GLUCOSE MONITOR) DEVI, Use to test blood sugars four times a day, Disp: , Rfl:    Cholecalciferol 50 MCG (2000 UT) CAPS, Take 2,000 Units by mouth daily., Disp: , Rfl:    diclofenac (VOLTAREN) 75 MG EC tablet, TAKE 1 TABLET BY MOUTH TWICE A DAY, Disp: 60 tablet, Rfl: 2   empagliflozin (JARDIANCE) 25 MG TABS tablet, Take 1 tablet by mouth daily., Disp: , Rfl:    ezetimibe (ZETIA) 10 MG tablet, Take 1 tablet once a day for cholesterol, Disp: , Rfl:    gabapentin (NEURONTIN) 600 MG tablet, Take 1 tablet (600 mg total) by mouth at bedtime., Disp: 30 tablet, Rfl: 2   Glucagon (GVOKE HYPOPEN 1-PACK) 1 MG/0.2ML SOAJ, Inject 1 each into the skin daily as needed., Disp: 0.4 mL, Rfl: 0   insulin glargine (LANTUS) 100 UNIT/ML Solostar Pen, Inject 15-24 Units into the skin at bedtime., Disp: , Rfl:    Insulin Pen Needle 32G X 4 MM MISC, Use as directed for insulin injection once a day, Disp: , Rfl:    Multiple Vitamin (MULTIVITAMIN) tablet, Take 1 tablet by mouth daily., Disp: , Rfl:    nateglinide (STARLIX) 120 MG tablet, Take 1 tablet by mouth daily before supper., Disp: , Rfl:    Omega-3 Fatty Acids (FISH OIL PO), Take 2 capsules by mouth daily., Disp: , Rfl:    ONETOUCH  VERIO test strip, 1 each by Other route as needed., Disp: , Rfl:    ramipril (ALTACE) 10 MG capsule, Take 10 mg by mouth daily., Disp: , Rfl:   No Known Allergies   ROS  Constitutional: Negative for fever, positive for mild  weight change.  Respiratory: Negative for cough and shortness of breath.   Cardiovascular: Negative for chest pain or palpitations.  Gastrointestinal: Negative for abdominal pain, no bowel changes.  Musculoskeletal: Negative for gait problem or joint swelling.  Skin: Negative for rash.  Neurological: Negative for dizziness or headache.  No other specific complaints in a complete review of systems  (except as listed in HPI above).    Objective  Vitals:   11/23/21 0819  BP: 128/74  Pulse: 90  Resp: 16  Temp: 98.3 F (36.8 C)  TempSrc: Oral  SpO2: 98%  Weight: 168 lb 3.2 oz (76.3 kg)  Height: 5' 5"  (1.651 m)    Body mass index is 27.99 kg/m.  Physical Exam  Constitutional: Patient appears well-developed and well-nourished. No distress.  HENT: Head: Normocephalic and atraumatic. Ears: B TMs ok, no erythema or effusion; Nose: Nose normal. Mouth/Throat: Oropharynx is clear and moist. No oropharyngeal exudate.  Eyes: Conjunctivae and EOM are normal. Pupils are equal, round, and reactive to light. No scleral icterus.  Neck: Normal range of motion. Neck supple. No JVD present. No thyromegaly present.  Cardiovascular: Normal rate, regular rhythm and normal heart sounds.  No murmur heard. No BLE edema. Pulmonary/Chest: Effort normal and breath sounds normal. No respiratory distress. Abdominal: Soft. Bowel sounds are normal, no distension. There is no tenderness. no masses MALE GENITALIA: he sees urologist  RECTAL: done  Musculoskeletal: Normal range of motion, no joint effusions. No gross deformities. His right arm is getting stronger, seems symmetrical on exam  Neurological: he is alert and oriented to person, place, and time. No cranial nerve deficit. Coordination, balance, strength, speech and gait are normal.  Skin: Skin is warm and dry. No rash noted. No erythema.  Psychiatric: Patient has a normal mood and affect. behavior is normal. Judgment and thought content normal.   Recent Results (from the past 2160 hour(s))  PSA     Status: None   Collection Time: 09/26/21 11:35 AM  Result Value Ref Range   Prostate Specific Ag, Serum 3.5 0.0 - 4.0 ng/mL    Comment: Roche ECLIA methodology. According to the American Urological Association, Serum PSA should decrease and remain at undetectable levels after radical prostatectomy. The AUA defines biochemical recurrence as an  initial PSA value 0.2 ng/mL or greater followed by a subsequent confirmatory PSA value 0.2 ng/mL or greater. Values obtained with different assay methods or kits cannot be used interchangeably. Results cannot be interpreted as absolute evidence of the presence or absence of malignant disease.   VITAMIN D 25 Hydroxy (Vit-D Deficiency, Fractures)     Status: None   Collection Time: 10/13/21 12:00 AM  Result Value Ref Range   Vit D, 25-Hydroxy >916   Basic metabolic panel     Status: Abnormal   Collection Time: 10/13/21 12:00 AM  Result Value Ref Range   Glucose 78    BUN 17 4 - 21   CO2 29 (A) 13 - 22   Creatinine 1.3 0.6 - 1.3   Potassium 3.8 3.5 - 5.1 mEq/L   Sodium 141 137 - 147   Chloride 104 99 - 108  Comprehensive metabolic panel     Status: None   Collection Time:  10/13/21 12:00 AM  Result Value Ref Range   eGFR 65    Calcium 9.1 8.7 - 10.7   Albumin 4.2 3.5 - 5.0  Lipid panel     Status: None   Collection Time: 10/13/21 12:00 AM  Result Value Ref Range   Triglycerides 49 40 - 160   Cholesterol 120 0 - 200   HDL 51 35 - 70   LDL Cholesterol 59   Hepatic function panel     Status: Abnormal   Collection Time: 10/13/21 12:00 AM  Result Value Ref Range   Alkaline Phosphatase 69 25 - 125   ALT 118 (A) 10 - 40 U/L   AST 63 (A) 14 - 40   Bilirubin, Direct 0.2 0.01 - 0.4   Bilirubin, Total 0.9   Hemoglobin A1c     Status: None   Collection Time: 10/13/21 12:00 AM  Result Value Ref Range   Hemoglobin A1C 7.2   TSH     Status: None   Collection Time: 10/13/21 12:00 AM  Result Value Ref Range   TSH 1.85 0.41 - 5.90     Fall Risk:    11/23/2021    8:21 AM 01/19/2021   10:11 AM 12/08/2020   10:55 AM 11/25/2020   11:23 AM 11/19/2020    7:32 AM  Fall Risk   Falls in the past year? 0 0 0 0 0  Number falls in past yr:  0 0 0 0  Injury with Fall?  0 0 0 0  Risk for fall due to : No Fall Risks No Fall Risks No Fall Risks No Fall Risks No Fall Risks  Follow up Falls  prevention discussed;Education provided;Falls evaluation completed Falls evaluation completed Falls evaluation completed Falls evaluation completed Falls prevention discussed     Functional Status Survey: Is the patient deaf or have difficulty hearing?: No Does the patient have difficulty seeing, even when wearing glasses/contacts?: No Does the patient have difficulty concentrating, remembering, or making decisions?: No Does the patient have difficulty walking or climbing stairs?: No Does the patient have difficulty dressing or bathing?: No Does the patient have difficulty doing errands alone such as visiting a doctor's office or shopping?: No    Assessment & Plan  1. Well adult exam  - Urine Microalbumin w/creat. ratio - HIV antibody (with reflex)  2. Dyslipidemia associated with type 2 diabetes mellitus ([facility])  - Urine Microalbumin w/creat. ratio - HM Diabetes Foot Exam - Glutamic acid decarboxylase auto abs - Anti-islet cell antibody  3. Screen for STD (sexually transmitted disease)  - HIV antibody (with reflex)  4. Elevated liver enzymes  - Hepatic function panel  5. Elevated hemoglobin A1c  - Glutamic acid decarboxylase auto abs - Anti-islet cell antibody  6. Need for shingles vaccine  - Varicella-zoster vaccine IM  7. Cervical spondylosis with radiculopathy      -Prostate cancer screening and PSA options (with potential risks and benefits of testing vs not testing) were discussed along with recent recs/guidelines. -USPSTF grade A and B recommendations reviewed with patient; age-appropriate recommendations, preventive care, screening tests, etc discussed and encouraged; healthy living encouraged; see AVS for patient education given to patient -Discussed importance of 150 minutes of physical activity weekly, eat two servings of fish weekly, eat one serving of tree nuts ( cashews, pistachios, pecans, almonds.Marland Kitchen) every other day, eat 6 servings of fruit/vegetables  daily and drink plenty of water and avoid sweet beverages.  -Reviewed Health Maintenance: yes

## 2021-11-22 NOTE — Patient Instructions (Signed)

## 2021-11-23 ENCOUNTER — Encounter: Payer: Self-pay | Admitting: Family Medicine

## 2021-11-23 ENCOUNTER — Ambulatory Visit (INDEPENDENT_AMBULATORY_CARE_PROVIDER_SITE_OTHER): Payer: BC Managed Care – PPO | Admitting: Family Medicine

## 2021-11-23 VITALS — BP 128/74 | HR 90 | Temp 98.3°F | Resp 16 | Ht 65.0 in | Wt 168.2 lb

## 2021-11-23 DIAGNOSIS — M4722 Other spondylosis with radiculopathy, cervical region: Secondary | ICD-10-CM

## 2021-11-23 DIAGNOSIS — E785 Hyperlipidemia, unspecified: Secondary | ICD-10-CM

## 2021-11-23 DIAGNOSIS — Z23 Encounter for immunization: Secondary | ICD-10-CM | POA: Diagnosis not present

## 2021-11-23 DIAGNOSIS — R7309 Other abnormal glucose: Secondary | ICD-10-CM

## 2021-11-23 DIAGNOSIS — Z0000 Encounter for general adult medical examination without abnormal findings: Secondary | ICD-10-CM

## 2021-11-23 DIAGNOSIS — Z113 Encounter for screening for infections with a predominantly sexual mode of transmission: Secondary | ICD-10-CM | POA: Diagnosis not present

## 2021-11-23 DIAGNOSIS — E1169 Type 2 diabetes mellitus with other specified complication: Secondary | ICD-10-CM

## 2021-11-23 DIAGNOSIS — R748 Abnormal levels of other serum enzymes: Secondary | ICD-10-CM | POA: Diagnosis not present

## 2021-11-23 NOTE — Assessment & Plan Note (Signed)
He had PT and doing better, does not want surgery

## 2021-12-08 ENCOUNTER — Other Ambulatory Visit: Payer: Self-pay | Admitting: Family Medicine

## 2021-12-08 DIAGNOSIS — E1169 Type 2 diabetes mellitus with other specified complication: Secondary | ICD-10-CM

## 2021-12-08 LAB — ISLET CELL AB SCREEN RFLX TO TITER: ISLET CELL ANTIBODY SCREEN: NEGATIVE

## 2021-12-08 LAB — HEPATIC FUNCTION PANEL
AG Ratio: 1.8 (calc) (ref 1.0–2.5)
ALT: 32 U/L (ref 9–46)
AST: 31 U/L (ref 10–35)
Albumin: 4.2 g/dL (ref 3.6–5.1)
Alkaline phosphatase (APISO): 69 U/L (ref 35–144)
Bilirubin, Direct: 0.2 mg/dL (ref 0.0–0.2)
Globulin: 2.4 g/dL (calc) (ref 1.9–3.7)
Indirect Bilirubin: 0.4 mg/dL (calc) (ref 0.2–1.2)
Total Bilirubin: 0.6 mg/dL (ref 0.2–1.2)
Total Protein: 6.6 g/dL (ref 6.1–8.1)

## 2021-12-08 LAB — MICROALBUMIN / CREATININE URINE RATIO
Creatinine, Urine: 51 mg/dL (ref 20–320)
Microalb, Ur: <0.2 mg/dL

## 2021-12-08 LAB — GLUTAMIC ACID DECARBOXYLASE AUTO ABS: Glutamic Acid Decarb Ab: <5 IU/mL (ref <5)

## 2021-12-08 LAB — HIV ANTIBODY (ROUTINE TESTING W REFLEX): HIV 1&2 Ab, 4th Generation: NONREACTIVE

## 2021-12-16 IMAGING — CR DG CERVICAL SPINE COMPLETE 4+V
6 series · 6 of 6 positions shown · non-contrast
Comparison: None.

CLINICAL DATA: Neck pain with right-sided radiculopathy 3 months.

EXAM:
CERVICAL SPINE - COMPLETE 4+ VIEW

[c-spine lat]
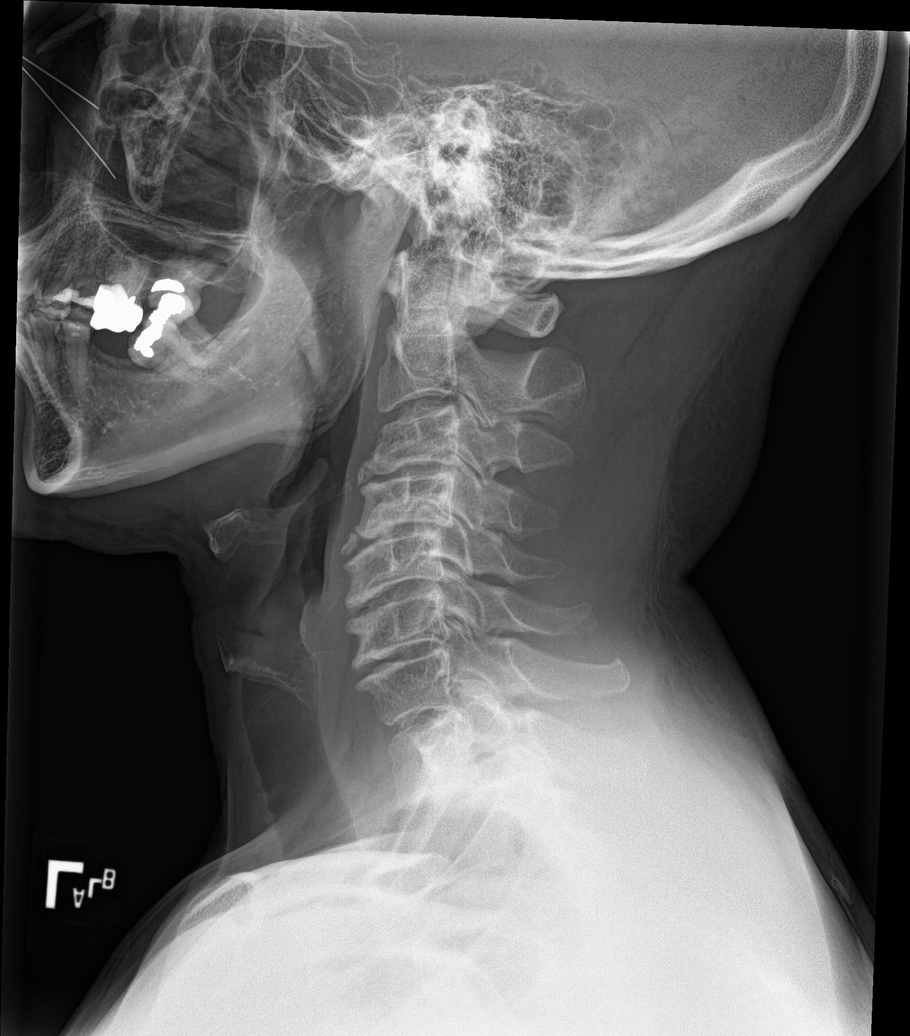

[c-spine obl (1 of 2)]
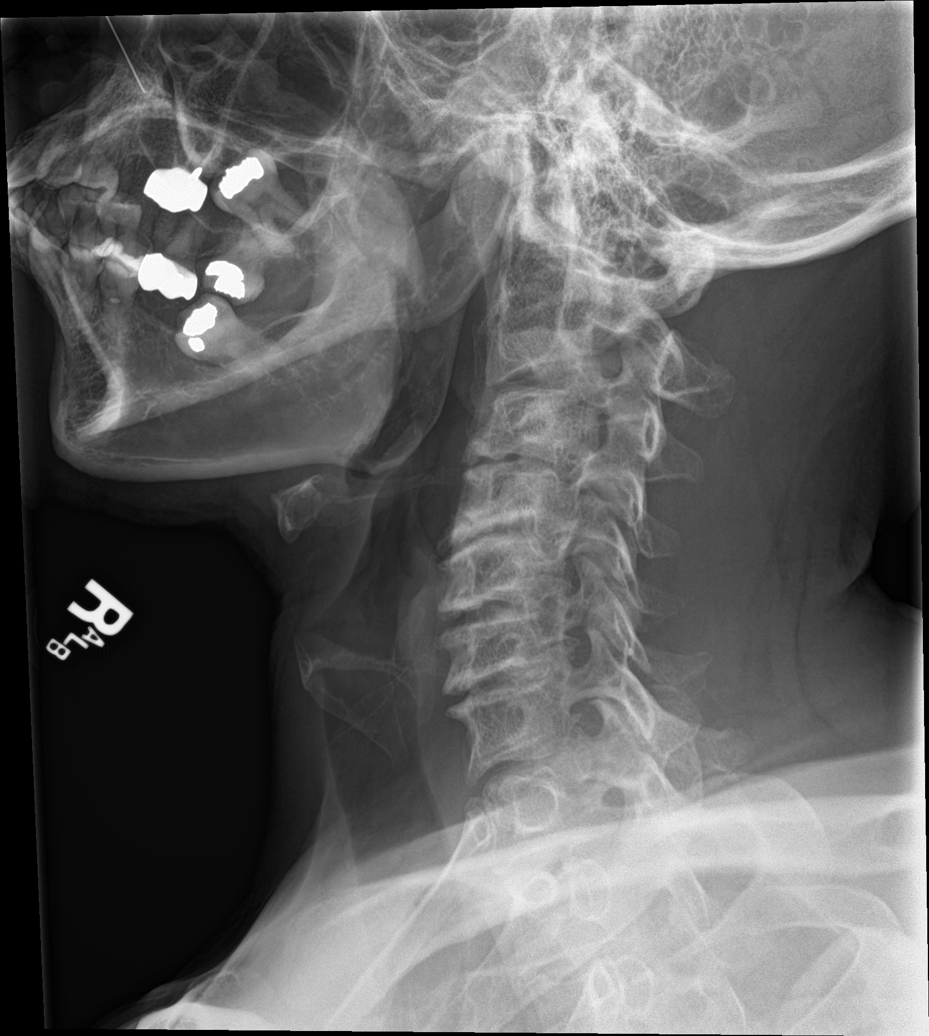

[c-spine obl (2 of 2)]
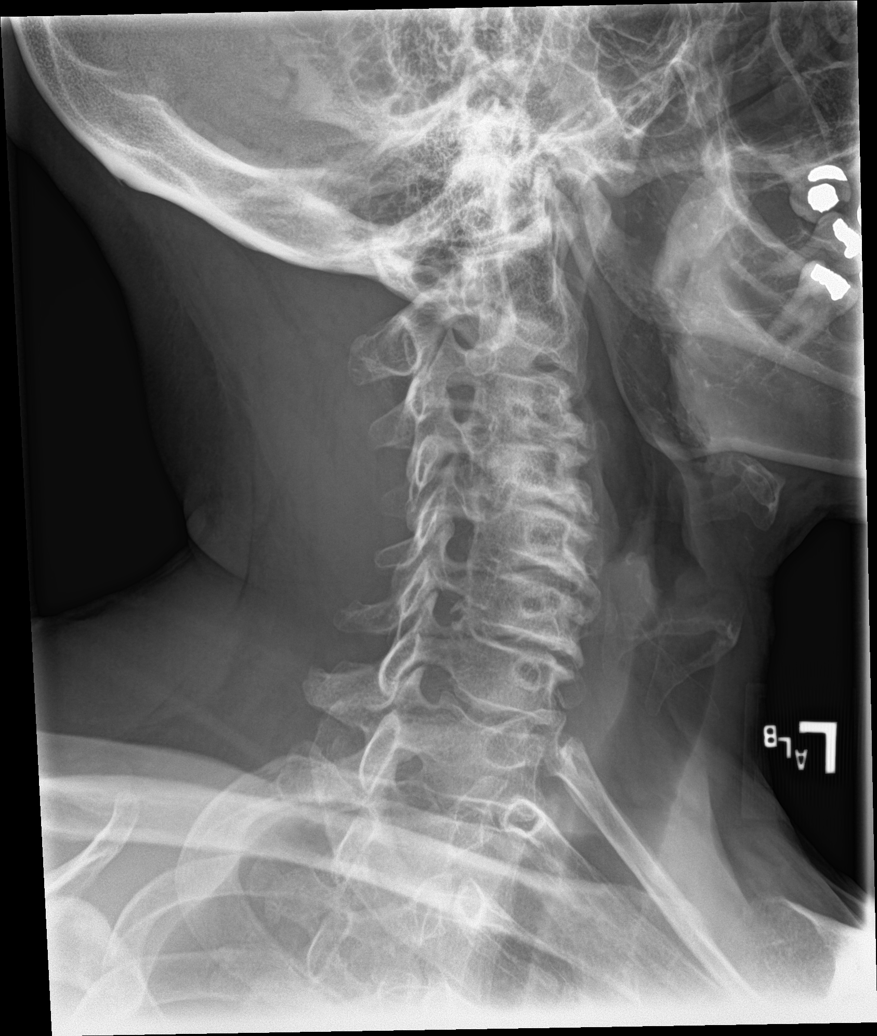

[c-spine ap]
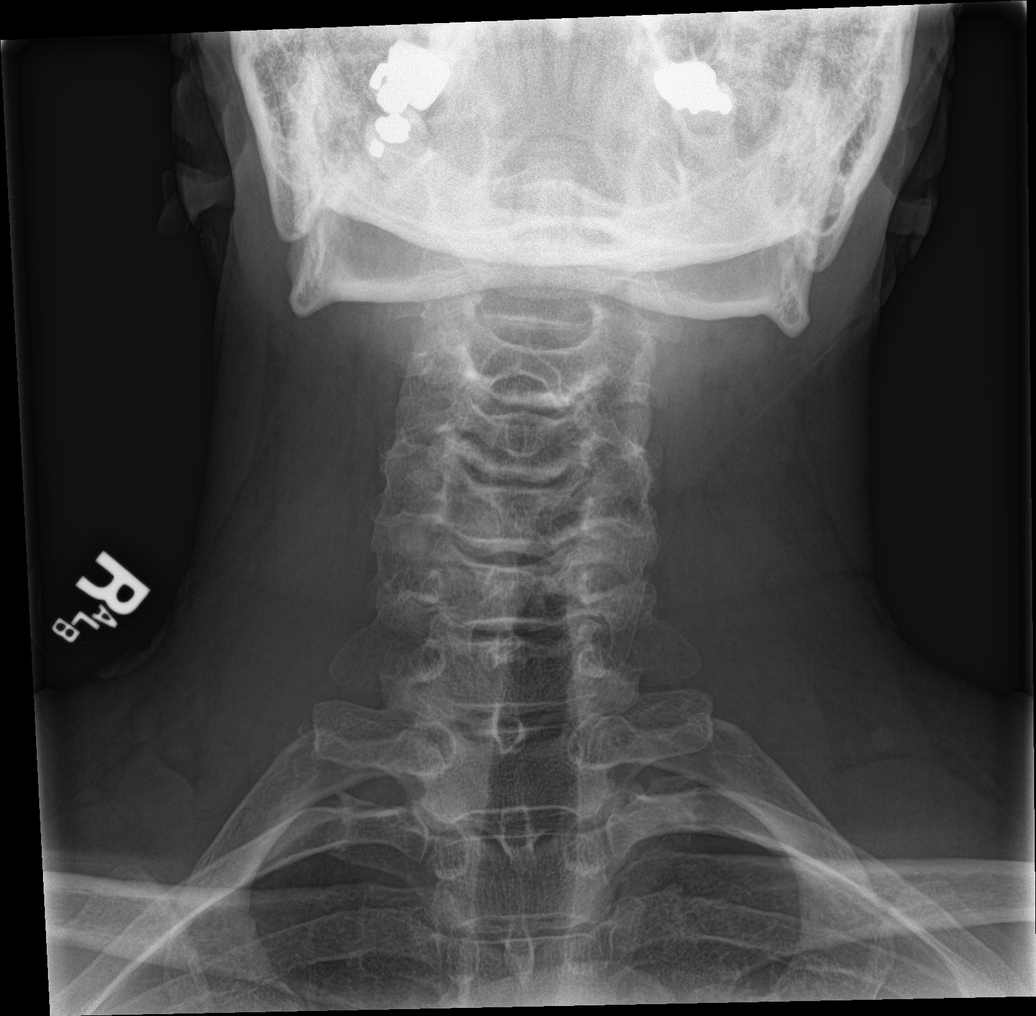

[c-spine open mouth]
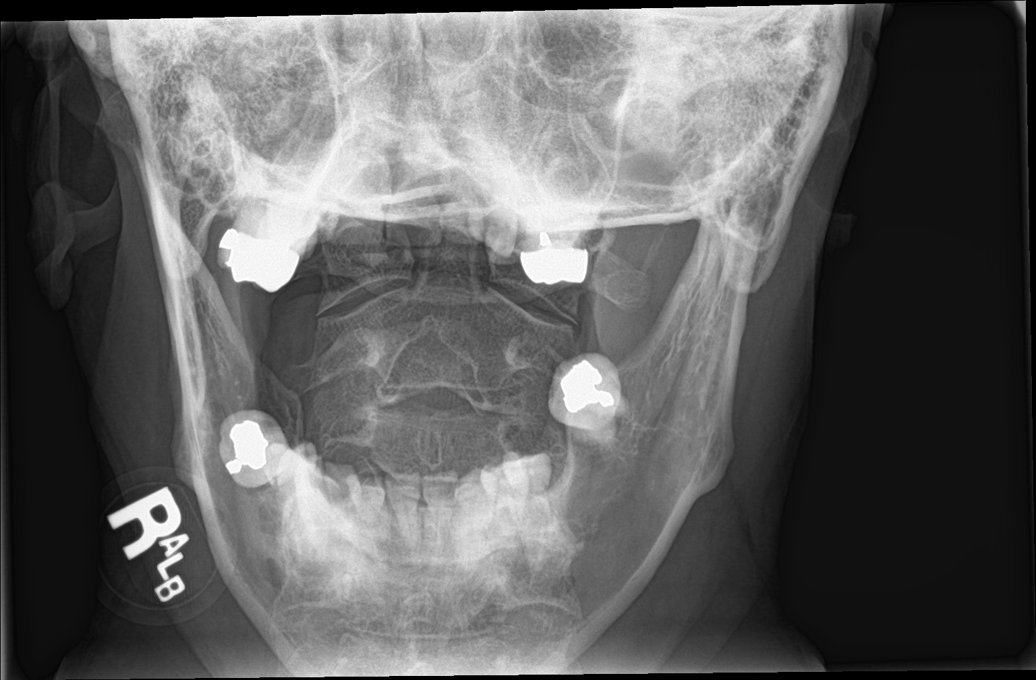

[[person_name]]
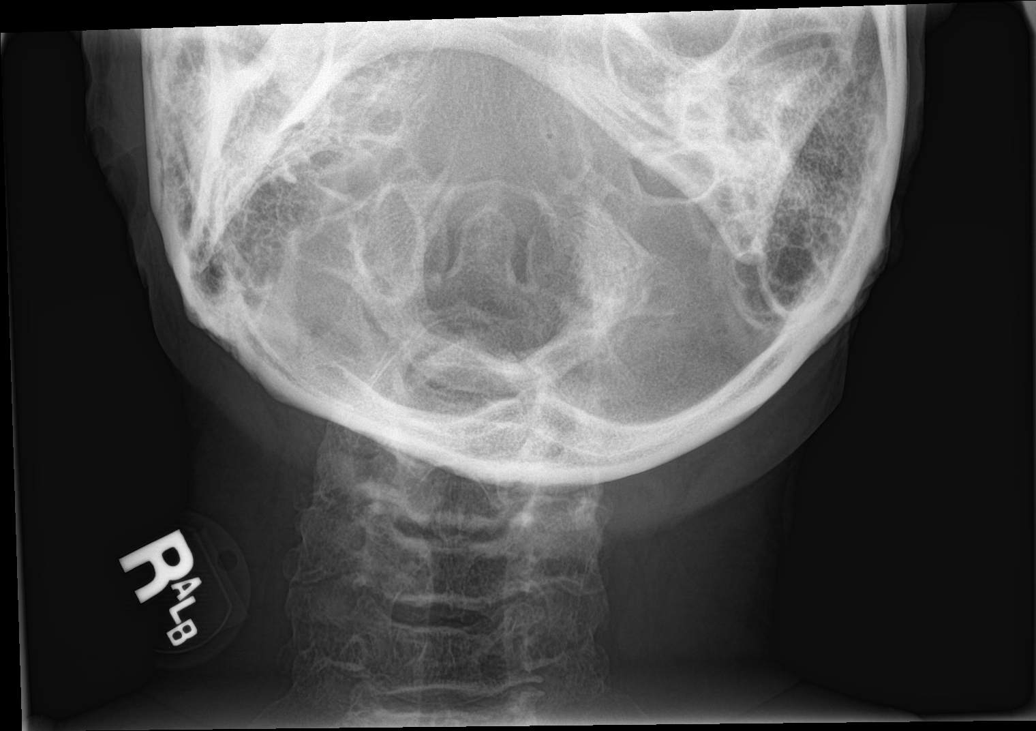

[6 of 6 positions shown; findings below may reference images not displayed]

FINDINGS: Subtle 2-3 mm posterior subluxation of C4 on C5 which is
degenerative in nature. There is moderate spondylosis throughout the
cervical spine. There is mild disc space narrowing from the C3-4
level to the C6-7 level. Prevertebral soft tissues are normal. There
is moderate uncovertebral joint spurring and facet arthropathy.
Atlantoaxial articulation is unremarkable. Severe left-sided neural
foraminal narrowing from the C3-4 level to the C5-6 level. Minimal
left-sided neural foraminal narrowing at the C6-7 and C7-T1 levels.
Moderate right-sided neural foraminal narrowing at the C4-5 level
with minimal right-sided neural from narrowing at the C6-7 level.
IMPRESSION: 1. Moderate spondylosis throughout the cervical spine with mild
multilevel disc disease from the C3-4 level to the C6-7 level.
2. Significant bilateral neural foraminal narrowing as described
most prominent on the left from the C3-4 level to the C5-6 level.

## 2021-12-28 NOTE — Progress Notes (Unsigned)
Name: Jack Cardenas   MRN: [id number]    DOB: [birth year]   Date:12/29/2021       Progress Note  Subjective  Chief Complaint  Discuss Lab Results  HPI  Cervical radiculopathy: seen by Dr. Zigmund Daniel, had PT and is doing much better, he denies tingling, numbness and stopped diclofenac and gabapentin. He has noticed increase in strength since rehab. He states still not even with left side, he states 20 lbs different between right and left side.   DMII : currently under the care of [facility] but he is going to start seeing Dr. Honor Junes end of January, he is very compliant with his diet and exercise and last A1C was 7.2 %. We will give him CGM samples and rx to wear before he goes to see new Endo . Antibody tests negative. He denies polyphagia, polydipsia and polyuria   Elevated liver enzymes: back to normal, he does not drink alcohol , eats balanced diets, and liver enzymes was up once when endo checked but back to normal range   Vitamin D deficiency: last level was high, he stopped taking it for two weeks and currently taking half dose of what he used to take. He thinks currently 1000 units per day   HTN: no chest pain, palpitation or SOB. He takes Altace   Dyslipidemia: LDL down to 59, continue atorvastatin and zetia   Patient Active Problem List   Diagnosis Date Noted   Cervical spondylosis with radiculopathy 12/08/2020   Cervical radiculitis 12/08/2020   BPH without obstruction/lower urinary tract symptoms 09/26/2019   History of elevated PSA 09/24/2018   Dyslipidemia associated with type 2 diabetes mellitus ([place])    Mixed dyslipidemia 03/03/2016   Hypertension, essential 03/03/2016   Vitamin D deficiency 03/03/2016   Hypertension associated with stage 2 chronic kidney disease due to type 2 diabetes mellitus ([place]) 03/03/2016    Past Surgical History:  Procedure Laterality Date   COLONOSCOPY WITH PROPOFOL N/A 12/11/2014   Procedure: COLONOSCOPY WITH PROPOFOL;  Surgeon: Lucilla Lame, MD;  Location: [place];  Service: Endoscopy;  Laterality: N/A;  Diabetic - insulin    Family History  Problem Relation Age of Onset   Renal Disease Father    Diabetes Father    Thyroid disease Sister    Metabolic syndrome Sister    Eczema Sister    Prostate cancer Paternal Grandfather    Colon cancer Paternal Grandfather     Social History   Tobacco Use   Smoking status: Never   Smokeless tobacco: Never  Substance Use Topics   Alcohol use: Not Currently     Current Outpatient Medications:    amLODipine (NORVASC) 5 MG tablet, Take 5 mg by mouth daily., Disp: , Rfl:    aspirin 81 MG EC tablet, Take 81 mg by mouth daily., Disp: , Rfl:    atorvastatin (LIPITOR) 40 MG tablet, Take 40 mg by mouth daily., Disp: , Rfl:    Blood Glucose Monitoring Suppl (GLUCOCOM BLOOD GLUCOSE MONITOR) DEVI, Use to test blood sugars four times a day, Disp: , Rfl:    Cholecalciferol 50 MCG (2000 UT) CAPS, Take 2,000 Units by mouth daily., Disp: , Rfl:    empagliflozin (JARDIANCE) 25 MG TABS tablet, Take 1 tablet by mouth daily., Disp: , Rfl:    ezetimibe (ZETIA) 10 MG tablet, Take 1 tablet once a day for cholesterol, Disp: , Rfl:    Glucagon (GVOKE HYPOPEN 1-PACK) 1 MG/0.2ML SOAJ, Inject 1 each into the skin daily  as needed., Disp: 0.4 mL, Rfl: 0   insulin glargine (LANTUS) 100 UNIT/ML Solostar Pen, Inject 15-24 Units into the skin at bedtime., Disp: , Rfl:    Insulin Pen Needle 32G X 4 MM MISC, Use as directed for insulin injection once a day, Disp: , Rfl:    Multiple Vitamin (MULTIVITAMIN) tablet, Take 1 tablet by mouth daily., Disp: , Rfl:    nateglinide (STARLIX) 120 MG tablet, Take 1 tablet by mouth daily before supper., Disp: , Rfl:    Omega-3 Fatty Acids (FISH OIL PO), Take 2 capsules by mouth daily., Disp: , Rfl:    ONETOUCH VERIO test strip, 1 each by Other route as needed., Disp: , Rfl:    ramipril (ALTACE) 10 MG capsule, Take 10 mg by mouth daily., Disp: , Rfl:   No Known  Allergies  I personally reviewed active problem list, medication list, allergies, family history, social history, health maintenance with the patient/caregiver today.   ROS  Constitutional: Negative for fever or weight change.  Respiratory: Negative for cough and shortness of breath.   Cardiovascular: Negative for chest pain or palpitations.  Gastrointestinal: Negative for abdominal pain, no bowel changes.  Musculoskeletal: Negative for gait problem or joint swelling.  Skin: Negative for rash.  Neurological: Negative for dizziness or headache.  No other specific complaints in a complete review of systems (except as listed in HPI above).   Objective  Vitals:   12/29/21 0818  BP: 116/70  Pulse: 95  Resp: 16  Temp: 98.3 F (36.8 C)  TempSrc: Oral  SpO2: 98%  Weight: 173 lb 1.6 oz (78.5 kg)  Height: _0  (1.651 m)    Body mass index is 28.81 kg/m.  Physical Exam  Constitutional: Patient appears well-developed and well-nourished.  No distress.  HEENT: head atraumatic, normocephalic, pupils equal and reactive to light, neck supple Cardiovascular: Normal rate, regular rhythm and normal heart sounds.  No murmur heard. No BLE edema. Pulmonary/Chest: Effort normal and breath sounds normal. No respiratory distress. Abdominal: Soft.  There is no tenderness. Psychiatric: Patient has a normal mood and affect. behavior is normal. Judgment and thought content normal.   Recent Results (from the past 2160 hour(s))  VITAMIN D 25 Hydroxy (Vit-D Deficiency, Fractures)     Status: None   Collection Time: 10/13/21 12:00 AM  Result Value Ref Range   Vit D, 25-Hydroxy >161   Basic metabolic panel     Status: Abnormal   Collection Time: 10/13/21 12:00 AM  Result Value Ref Range   Glucose 78    BUN 17 4 - 21   CO2 29 (A) 13 - 22   Creatinine 1.3 0.6 - 1.3   Potassium 3.8 3.5 - 5.1 mEq/L   Sodium 141 137 - 147   Chloride 104 99 - 108  Comprehensive metabolic panel     Status: None    Collection Time: 10/13/21 12:00 AM  Result Value Ref Range   eGFR 65    Calcium 9.1 8.7 - 10.7   Albumin 4.2 3.5 - 5.0  Lipid panel     Status: None   Collection Time: 10/13/21 12:00 AM  Result Value Ref Range   Triglycerides 49 40 - 160   Cholesterol 120 0 - 200   HDL 51 35 - 70   LDL Cholesterol 59   Hepatic function panel     Status: Abnormal   Collection Time: 10/13/21 12:00 AM  Result Value Ref Range   Alkaline Phosphatase 69 25 - 125  ALT 118 (A) 10 - 40 U/L   AST 63 (A) 14 - 40   Bilirubin, Direct 0.2 0.01 - 0.4   Bilirubin, Total 0.9   Hemoglobin A1c     Status: None   Collection Time: 10/13/21 12:00 AM  Result Value Ref Range   Hemoglobin A1C 7.2   TSH     Status: None   Collection Time: 10/13/21 12:00 AM  Result Value Ref Range   TSH 1.85 0.41 - 5.90  Urine Microalbumin w/creat. ratio     Status: None   Collection Time: 11/23/21  9:17 AM  Result Value Ref Range   Creatinine, Urine 51 20 - 320 mg/dL   Microalb, Ur <0.2 mg/dL    Comment: Reference Range Not established    Microalb Creat Ratio NOTE <30 mcg/mg creat    Comment: NOTE: The urine albumin value is less than  0.2 mg/dL therefore we are unable to calculate  excretion and/or creatinine ratio. . The ADA defines abnormalities in albumin excretion as follows: Marland Kitchen Albuminuria Category        Result (mcg/mg creatinine) . Normal to Mildly increased   <30 Moderately increased         30-299  Severely increased           > OR = 300 . The ADA recommends that at least two of three specimens collected within a 3-6 month period be abnormal before considering a patient to be within a diagnostic category.   HIV antibody (with reflex)     Status: None   Collection Time: 11/23/21  9:17 AM  Result Value Ref Range   HIV 1&2 Ab, 4th Generation NON-REACTIVE NON-REACTIVE    Comment: HIV-1 antigen and HIV-1/HIV-2 antibodies were not detected. There is no laboratory evidence of HIV infection. Marland Kitchen PLEASE NOTE: This  information has been disclosed to you from records whose confidentiality may be protected by state law.  If your state requires such protection, then the state law prohibits you from making any further disclosure of the information without the specific written consent of the person to whom it pertains, or as otherwise permitted by law. A general authorization for the release of medical or other information is NOT sufficient for this purpose. . For additional information please refer to http://education.questdiagnostics.com/faq/FAQ106 (This link is being provided for informational/ educational purposes only.) . Marland Kitchen The performance of this assay has not been clinically validated in patients less than 81 years old. .   Glutamic acid decarboxylase auto abs     Status: None   Collection Time: 11/23/21  9:17 AM  Result Value Ref Range   Glutamic Acid Decarb Ab <5 <5 IU/mL    Comment: . This test was performed using the GAD65 ELISA method, which is standardized against the International reference preparation 97/550. Marland Kitchen   Hepatic function panel     Status: None   Collection Time: 11/23/21  9:17 AM  Result Value Ref Range   Total Protein 6.6 6.1 - 8.1 g/dL   Albumin 4.2 3.6 - 5.1 g/dL   Globulin 2.4 1.9 - 3.7 g/dL (calc)   AG Ratio 1.8 1.0 - 2.5 (calc)   Total Bilirubin 0.6 0.2 - 1.2 mg/dL   Bilirubin, Direct 0.2 0.0 - 0.2 mg/dL   Indirect Bilirubin 0.4 0.2 - 1.2 mg/dL (calc)   Alkaline phosphatase (APISO) 69 35 - 144 U/L   AST 31 10 - 35 U/L   ALT 32 9 - 46 U/L  Islet Cell Ab Screen  rflx to Titer     Status: None   Collection Time: 11/23/21  9:17 AM  Result Value Ref Range   ISLET CELL ANTIBODY SCREEN NEGATIVE NEGATIVE    Comment: . This test was developed and its analytical performance characteristics have been determined by [employer]. It has not been cleared or approved by FDA. This assay has been validated pursuant to the CLIA regulations and is used for clinical  purposes.     PHQ2/9:    12/29/2021    8:18 AM 11/23/2021    8:21 AM 11/23/2021    8:20 AM 01/19/2021   10:11 AM 12/08/2020   10:56 AM  Depression screen PHQ 2/9  Decreased Interest 0 0 0 0 0  Down, Depressed, Hopeless 0 0 0 0 0  PHQ - 2 Score 0 0 0 0 0  Altered sleeping 0 0  0 0  Tired, decreased energy 0 0  0 0  Change in appetite 0 0  0 0  Feeling bad or failure about yourself  0 0  0 0  Trouble concentrating 0 0  0 0  Moving slowly or fidgety/restless 0 0  0 0  Suicidal thoughts 0 0  0 0  PHQ-9 Score 0 0  0 0  Difficult doing work/chores Not difficult at all   Not difficult at all Not difficult at all    phq 9 is negative   Fall Risk:    12/29/2021    8:18 AM 11/23/2021    8:21 AM 01/19/2021   10:11 AM 12/08/2020   10:55 AM 11/25/2020   11:23 AM  Fall Risk   Falls in the past year? 0 0 0 0 0  Number falls in past yr: 0  0 0 0  Injury with Fall? 0  0 0 0  Risk for fall due to : _0   Follow up Falls prevention discussed;Education provided;Falls evaluation completed Falls prevention discussed;Education provided;Falls evaluation completed Falls evaluation completed Falls evaluation completed Falls evaluation completed      Functional Status Survey: Is the patient deaf or have difficulty hearing?: No Does the patient have difficulty seeing, even when wearing glasses/contacts?: No Does the patient have difficulty concentrating, remembering, or making decisions?: No Does the patient have difficulty walking or climbing stairs?: No Does the patient have difficulty dressing or bathing?: No Does the patient have difficulty doing errands alone such as visiting a doctor's office or shopping?: No    Assessment & Plan  1. Dyslipidemia associated with type 2 diabetes mellitus (HCC)  - Continuous Blood Gluc Sensor (FREESTYLE LIBRE 2 SENSOR) MISC; 1 each by Does not apply route daily at 12 noon.  Dispense:  1 each; Refill: 2  2. Elevated liver enzymes  Resolved   3. Cervical spondylosis with radiculopathy  Doing better now  4. Vitamin D deficiency  On supplementation  5. Hypertension associated with stage 2 chronic kidney disease due to type 2 diabetes mellitus ([place])   6. Mixed dyslipidemia

## 2021-12-29 ENCOUNTER — Encounter: Payer: Self-pay | Admitting: Family Medicine

## 2021-12-29 ENCOUNTER — Ambulatory Visit (INDEPENDENT_AMBULATORY_CARE_PROVIDER_SITE_OTHER): Payer: BC Managed Care – PPO | Admitting: Family Medicine

## 2021-12-29 VITALS — BP 116/70 | HR 95 | Temp 98.3°F | Resp 16 | Ht 65.0 in | Wt 173.1 lb

## 2021-12-29 DIAGNOSIS — E559 Vitamin D deficiency, unspecified: Secondary | ICD-10-CM | POA: Diagnosis not present

## 2021-12-29 DIAGNOSIS — E1169 Type 2 diabetes mellitus with other specified complication: Secondary | ICD-10-CM | POA: Diagnosis not present

## 2021-12-29 DIAGNOSIS — E785 Hyperlipidemia, unspecified: Secondary | ICD-10-CM

## 2021-12-29 DIAGNOSIS — M4722 Other spondylosis with radiculopathy, cervical region: Secondary | ICD-10-CM | POA: Diagnosis not present

## 2021-12-29 DIAGNOSIS — R748 Abnormal levels of other serum enzymes: Secondary | ICD-10-CM

## 2021-12-29 DIAGNOSIS — E1122 Type 2 diabetes mellitus with diabetic chronic kidney disease: Secondary | ICD-10-CM

## 2021-12-29 DIAGNOSIS — E782 Mixed hyperlipidemia: Secondary | ICD-10-CM

## 2021-12-29 DIAGNOSIS — N182 Chronic kidney disease, stage 2 (mild): Secondary | ICD-10-CM

## 2021-12-29 DIAGNOSIS — I129 Hypertensive chronic kidney disease with stage 1 through stage 4 chronic kidney disease, or unspecified chronic kidney disease: Secondary | ICD-10-CM

## 2021-12-29 MED ORDER — FREESTYLE LIBRE 2 SENSOR MISC: 1.0000 | Freq: Every day | 2 refills | Status: DC

## 2022-03-01 DIAGNOSIS — Z794 Long term (current) use of insulin: Secondary | ICD-10-CM | POA: Diagnosis not present

## 2022-03-01 DIAGNOSIS — E1169 Type 2 diabetes mellitus with other specified complication: Secondary | ICD-10-CM | POA: Diagnosis not present

## 2022-03-01 DIAGNOSIS — E1159 Type 2 diabetes mellitus with other circulatory complications: Secondary | ICD-10-CM | POA: Diagnosis not present

## 2022-03-01 DIAGNOSIS — E113299 Type 2 diabetes mellitus with mild nonproliferative diabetic retinopathy without macular edema, unspecified eye: Secondary | ICD-10-CM | POA: Diagnosis not present

## 2022-06-08 ENCOUNTER — Encounter: Payer: Self-pay | Admitting: Family Medicine

## 2022-06-09 ENCOUNTER — Other Ambulatory Visit: Payer: Self-pay

## 2022-06-09 DIAGNOSIS — E1169 Type 2 diabetes mellitus with other specified complication: Secondary | ICD-10-CM

## 2022-06-09 MED ORDER — CONTOUR NEXT TEST VI STRP: ORAL_STRIP | 0 refills | Status: AC

## 2022-06-29 DIAGNOSIS — E119 Type 2 diabetes mellitus without complications: Secondary | ICD-10-CM | POA: Diagnosis not present

## 2022-06-29 DIAGNOSIS — H524 Presbyopia: Secondary | ICD-10-CM | POA: Diagnosis not present

## 2022-06-29 DIAGNOSIS — H04123 Dry eye syndrome of bilateral lacrimal glands: Secondary | ICD-10-CM | POA: Diagnosis not present

## 2022-06-29 LAB — HM DIABETES EYE EXAM

## 2022-07-20 ENCOUNTER — Encounter: Payer: Self-pay | Admitting: Family Medicine

## 2022-07-20 DIAGNOSIS — Z794 Long term (current) use of insulin: Secondary | ICD-10-CM | POA: Diagnosis not present

## 2022-07-20 DIAGNOSIS — E1169 Type 2 diabetes mellitus with other specified complication: Secondary | ICD-10-CM | POA: Diagnosis not present

## 2022-07-20 DIAGNOSIS — E1159 Type 2 diabetes mellitus with other circulatory complications: Secondary | ICD-10-CM | POA: Diagnosis not present

## 2022-07-20 DIAGNOSIS — E113299 Type 2 diabetes mellitus with mild nonproliferative diabetic retinopathy without macular edema, unspecified eye: Secondary | ICD-10-CM | POA: Diagnosis not present

## 2022-07-20 LAB — HEMOGLOBIN A1C: Hemoglobin A1C: 6.4

## 2022-09-19 ENCOUNTER — Other Ambulatory Visit: Payer: Self-pay | Admitting: *Deleted

## 2022-09-19 DIAGNOSIS — Z87898 Personal history of other specified conditions: Secondary | ICD-10-CM

## 2022-09-29 ENCOUNTER — Other Ambulatory Visit: Payer: BC Managed Care – PPO

## 2022-10-03 ENCOUNTER — Other Ambulatory Visit: Payer: BC Managed Care – PPO

## 2022-10-03 DIAGNOSIS — Z87898 Personal history of other specified conditions: Secondary | ICD-10-CM

## 2022-10-04 ENCOUNTER — Ambulatory Visit: Payer: BC Managed Care – PPO | Admitting: Urology

## 2022-10-04 ENCOUNTER — Encounter: Payer: Self-pay | Admitting: Urology

## 2022-10-04 VITALS — BP 118/76 | HR 84 | Ht 65.0 in | Wt 158.0 lb

## 2022-10-04 DIAGNOSIS — R972 Elevated prostate specific antigen [PSA]: Secondary | ICD-10-CM | POA: Diagnosis not present

## 2022-10-04 LAB — PSA: Prostate Specific Ag, Serum: 6.1 ng/mL — ABNORMAL HIGH (ref 0.0–4.0)

## 2022-10-04 NOTE — Progress Notes (Signed)
I, Duke Salvia, acting as a scribe for Jack Altes, MD., have documented all relevant documentation on the behalf of Jack Altes, MD, as directed by  Jack Altes, MD while in the presence of Jack Altes, MD.  10/04/2022 10:50 AM   Jack Cardenas Dec 03, 1965 [id number]  Referring provider: Alba Cory, MD [address] [place],  [place] [address]  Chief Complaint  Patient presents with   Elevated PSA     Urologic history: 1.  Elevated PSA -Prostate biopsy 12/2016; PSA 5.5; volume 33 g; benign pathology  HPI: 57 y.o. male presents for annual follow-up.  Doing well since last visit No bothersome LUTS Denies dysuria, gross hematuria Denies flank, abdominal or pelvic pain PSA on 10/03/22 increased to 6.1.   PMH: Past Medical History:  Diagnosis Date   Diabetes mellitus without complication (HCC)    Hyperlipidemia    Hypertension    MVP (mitral valve prolapse)    Dx'd as teenager. No issues.  Followed by PCP.    Surgical History: Past Surgical History:  Procedure Laterality Date   COLONOSCOPY WITH PROPOFOL N/A 12/11/2014   Procedure: COLONOSCOPY WITH PROPOFOL;  Surgeon: Midge Minium, MD;  Location: [facility];  Service: Endoscopy;  Laterality: N/A;  Diabetic - insulin    Home Medications:  Allergies as of 10/04/2022   No Known Allergies      Medication List        Accurate as of October 04, 2022 10:50 AM. If you have any questions, ask your nurse or doctor.          amLODipine 5 MG tablet Commonly known as: NORVASC Take 5 mg by mouth daily.   aspirin EC 81 MG tablet Take 81 mg by mouth daily.   atorvastatin 40 MG tablet Commonly known as: LIPITOR Take 40 mg by mouth daily.   Cholecalciferol 50 MCG (2000 UT) Caps Take 2,000 Units by mouth daily.   empagliflozin 25 MG Tabs tablet Commonly known as: JARDIANCE Take 1 tablet by mouth daily.   ezetimibe 10 MG tablet Commonly known as: ZETIA Take 1 tablet  once a day for cholesterol   FISH OIL PO Take 2 capsules by mouth daily.   FreeStyle Libre 2 Sensor Misc 1 each by Does not apply route daily at 12 noon.   GlucoCom Blood Glucose Monitor Devi Use to test blood sugars four times a day   Gvoke HypoPen 1-Pack 1 MG/0.2ML Soaj Generic drug: Glucagon Inject 1 each into the skin daily as needed.   insulin glargine 100 UNIT/ML Solostar Pen Commonly known as: LANTUS Inject 15-24 Units into the skin at bedtime.   Insulin Pen Needle 32G X 4 MM Misc Use as directed for insulin injection once a day   Mounjaro 5 MG/0.5ML Pen Generic drug: tirzepatide SMARTSIG:0.5 Milliliter(s) SUB-Q Once a Week   multivitamin tablet Take 1 tablet by mouth daily.   nateglinide 120 MG tablet Commonly known as: STARLIX Take 1 tablet by mouth daily before supper.   OneTouch Verio test strip Generic drug: glucose blood 1 each by Other route as needed.   Contour Next Test test strip Generic drug: glucose blood Use as instructed   ramipril 10 MG capsule Commonly known as: ALTACE Take 10 mg by mouth daily.        Family History: Family History  Problem Relation Age of Onset   Renal Disease Father    Diabetes Father    Thyroid disease Sister  Metabolic syndrome Sister    Eczema Sister    Prostate cancer Paternal Grandfather    Colon cancer Paternal Grandfather     Social History:  reports that he has never smoked. He has never used smokeless tobacco. He reports that he does not currently use alcohol. He reports that he does not use drugs.   Physical Exam: BP 118/76   Pulse 84   Ht 5\' 5"  (1.651 m)   Wt 158 lb (71.7 kg)   BMI 26.29 kg/m   Constitutional:  Alert and oriented, No acute distress. HEENT:  AT Respiratory: Normal respiratory effort, no increased work of breathing. GU: Prostate 40 g, smooth without nodules Psychiatric: Normal mood and affect.   Assessment & Plan:    1.  Elevated PSA PSA bump to 6.1 with prior  negative biopsy PSA 5.5. Stable DRE. We discussed options of prostate MRI vs repeating his PSA in 3 months and MRI if persistently elevated. He has elected the latter and we will call him with PSA results.  I have reviewed the above documentation for accuracy and completeness, and I agree with the above.   Jack Altes, MD  [facility] [address], [address] [place], [place] [address] [phone]

## 2022-10-05 ENCOUNTER — Encounter: Payer: Self-pay | Admitting: Urology

## 2022-11-28 NOTE — Progress Notes (Unsigned)
Name: Jack Cardenas   MRN: [id number]    DOB: 1965/05/21   Date:11/29/2022       Progress Note  Subjective  Chief Complaint  Annual Exam  HPI  Patient presents for annual CPE.  IPSS Questionnaire (AUA-7): Over the past month.   1)  How often have you had a sensation of not emptying your bladder completely after you finish urinating?  0 - Not at all  2)  How often have you had to urinate again less than two hours after you finished urinating? 0 - Not at all  3)  How often have you found you stopped and started again several times when you urinated?  0 - Not at all  4) How difficult have you found it to postpone urination?  0 - Not at all  5) How often have you had a weak urinary stream?  0 - Not at all  6) How often have you had to push or strain to begin urination?  0 - Not at all  7) How many times did you most typically get up to urinate from the time you went to bed until the time you got up in the morning?  1 - 1 time  Total score:  0-7 mildly symptomatic   8-19 moderately symptomatic   20-35 severely symptomatic     Diet: balanced diet, follows diabetic diet  Exercise: continue regular activity  Last Dental Exam: he is due for a visit  Last Eye Exam: up to date  Depression: phq 9 is negative    11/29/2022    8:11 AM 12/29/2021    8:18 AM 11/23/2021    8:21 AM 11/23/2021    8:20 AM 01/19/2021   10:11 AM  Depression screen PHQ 2/9  Decreased Interest 0 0 0 0 0  Down, Depressed, Hopeless 0 0 0 0 0  PHQ - 2 Score 0 0 0 0 0  Altered sleeping 0 0 0  0  Tired, decreased energy 0 0 0  0  Change in appetite 0 0 0  0  Feeling bad or failure about yourself  0 0 0  0  Trouble concentrating 0 0 0  0  Moving slowly or fidgety/restless 0 0 0  0  Suicidal thoughts 0 0 0  0  PHQ-9 Score 0 0 0  0  Difficult doing work/chores  Not difficult at all   Not difficult at all    Hypertension:  BP Readings from Last 3 Encounters:  11/29/22 116/72  10/04/22 118/76  12/29/21 116/70     Obesity: Wt Readings from Last 3 Encounters:  11/29/22 153 lb 4.8 oz (69.5 kg)  10/04/22 158 lb (71.7 kg)  12/29/21 173 lb 1.6 oz (78.5 kg)   BMI Readings from Last 3 Encounters:  11/29/22 25.51 kg/m  10/04/22 26.29 kg/m  12/29/21 28.81 kg/m     Lipids:  Lab Results  Component Value Date   CHOL 120 10/13/2021   CHOL 118 08/30/2020   CHOL 116 09/17/2018   Lab Results  Component Value Date   HDL 51 10/13/2021   HDL 49 08/30/2020   HDL 40 09/17/2018   Lab Results  Component Value Date   LDLCALC 59 10/13/2021   LDLCALC 57 08/30/2020   LDLCALC 62 09/17/2018   Lab Results  Component Value Date   TRIG 49 10/13/2021   TRIG 34 (A) 08/30/2020   TRIG 51 09/17/2018   No results found for: "CHOLHDL" No results found for: "LDLDIRECT" Glucose:  Glucose  Date Value Ref Range Status  10/14/2014 100 (H) 65 - 99 mg/dL Final   Glucose, Bld  Date Value Ref Range Status  10/27/2016 59 (L) 65 - 99 mg/dL Final    Comment:    .            Fasting reference interval .    Glucose-Capillary  Date Value Ref Range Status  12/11/2014 88 65 - 99 mg/dL Final    Flowsheet Row Office Visit from 11/29/2022 in [facility]  AUDIT-C Score 0       Married STD testing and prevention (HIV/chl/gon/syphilis): 11/23/21 Sexual history: no problems Hep C Screening: 11/02/17 Skin cancer: Discussed monitoring for atypical lesions Colorectal cancer: 12/11/14 Prostate cancer:  elevated, seeing Dr. Lonna Cobb and will go back in Dec for a recheck    Lung cancer:  Low Dose CT Chest recommended if Age 69-80 years, 30 pack-year currently smoking OR have quit w/in 15years. Patient  is not a candidate for screening   AAA: The USPSTF recommends one-time screening with ultrasonography in men ages 33 to 75 years who have ever smoked. Patient is not a candidate, never smoked   Vaccines:   HPV: N/A Tdap: 2014  he will get it today  Shingrix: up to date Pneumonia:  N/A Flu: today  COVID-19: N/A  Advanced Care Planning: A voluntary discussion about advance care planning including the explanation and discussion of advance directives.  Discussed health care proxy and Living will, and the patient was able to identify a health care proxy as wife .  Patient does not have a living will and power of attorney of health care   Patient Active Problem List   Diagnosis Date Noted   Cervical spondylosis with radiculopathy 12/08/2020   Cervical radiculitis 12/08/2020   BPH without obstruction/lower urinary tract symptoms 09/26/2019   History of elevated PSA 09/24/2018   Dyslipidemia associated with type 2 diabetes mellitus (HCC)    Mixed dyslipidemia 03/03/2016   Hypertension, essential 03/03/2016   Vitamin D deficiency 03/03/2016   Hypertension associated with stage 2 chronic kidney disease due to type 2 diabetes mellitus (HCC) 03/03/2016    Past Surgical History:  Procedure Laterality Date   COLONOSCOPY WITH PROPOFOL N/A 12/11/2014   Procedure: COLONOSCOPY WITH PROPOFOL;  Surgeon: Midge Minium, MD;  Location: [facility];  Service: Endoscopy;  Laterality: N/A;  Diabetic - insulin    Family History  Problem Relation Age of Onset   Renal Disease Father    Diabetes Father    Thyroid disease Sister    Metabolic syndrome Sister    Eczema Sister    Prostate cancer Paternal Grandfather    Colon cancer Paternal Grandfather     Social History   Socioeconomic History   Marital status: Married    Spouse name: Yulian Dover   Number of children: 2   Years of education: 16   Highest education level: Bachelor's degree (e.g., BA, AB, BS)  Occupational History   Occupation: estimator     Comment: [employer]   Tobacco Use   Smoking status: Never   Smokeless tobacco: Never  Vaping Use   Vaping status: Never Used  Substance and Sexual Activity   Alcohol use: Not Currently   Drug use: Never   Sexual activity: Yes    Partners: Female   Other Topics Concern   Not on file  Social History Narrative   Not on file   Social Determinants of Health  Financial Resource Strain: Low Risk  (11/29/2022)   Overall Financial Resource Strain (CARDIA)    Difficulty of Paying Living Expenses: Not hard at all  Food Insecurity: No Food Insecurity (11/29/2022)   Hunger Vital Sign    Worried About Running Out of Food in the Last Year: Never true    Ran Out of Food in the Last Year: Never true  Transportation Needs: No Transportation Needs (11/29/2022)   PRAPARE - Administrator, Civil Service (Medical): No    Lack of Transportation (Non-Medical): No  Physical Activity: Sufficiently Active (11/29/2022)   Exercise Vital Sign    Days of Exercise per Week: 6 days    Minutes of Exercise per Session: 60 min  Stress: No Stress Concern Present (11/29/2022)   [employer] of Occupational Health - Occupational Stress Questionnaire    Feeling of Stress : Not at all  Social Connections: Socially Integrated (11/29/2022)   Social Connection and Isolation Panel [NHANES]    Frequency of Communication with Friends and Family: More than three times a week    Frequency of Social Gatherings with Friends and Family: More than three times a week    Attends Religious Services: More than 4 times per year    Active Member of [employer] or Organizations: Yes    Attends Engineer, structural: More than 4 times per year    Marital Status: Married  Catering manager Violence: Not At Risk (11/29/2022)   Humiliation, Afraid, Rape, and Kick questionnaire    Fear of Current or Ex-Partner: No    Emotionally Abused: No    Physically Abused: No    Sexually Abused: No     Current Outpatient Medications:    amLODipine (NORVASC) 5 MG tablet, Take 5 mg by mouth daily., Disp: , Rfl:    aspirin 81 MG EC tablet, Take 81 mg by mouth daily., Disp: , Rfl:    atorvastatin (LIPITOR) 40 MG tablet, Take 40 mg by mouth daily., Disp: , Rfl:     Cholecalciferol 50 MCG (2000 UT) CAPS, Take 2,000 Units by mouth daily., Disp: , Rfl:    empagliflozin (JARDIANCE) 25 MG TABS tablet, Take 1 tablet by mouth daily., Disp: , Rfl:    ezetimibe (ZETIA) 10 MG tablet, Take 1 tablet once a day for cholesterol, Disp: , Rfl:    glucose blood (CONTOUR NEXT TEST) test strip, Use as instructed, Disp: 200 each, Rfl: 0   Insulin Pen Needle 32G X 4 MM MISC, Use as directed for insulin injection once a day, Disp: , Rfl:    Multiple Vitamin (MULTIVITAMIN) tablet, Take 1 tablet by mouth daily., Disp: , Rfl:    Omega-3 Fatty Acids (FISH OIL PO), Take 2 capsules by mouth daily., Disp: , Rfl:    ONETOUCH VERIO test strip, 1 each by Other route as needed., Disp: , Rfl:    ramipril (ALTACE) 10 MG capsule, Take 10 mg by mouth daily., Disp: , Rfl:    tirzepatide (MOUNJARO) 5 MG/0.5ML Pen, Inject into the skin., Disp: , Rfl:   No Known Allergies   ROS  Constitutional: Negative for fever , positive for weight change.  Respiratory: Negative for cough and shortness of breath.   Cardiovascular: Negative for chest pain or palpitations.  Gastrointestinal: Negative for abdominal pain, no bowel changes.  Musculoskeletal: Negative for gait problem or joint swelling.  Skin: Negative for rash.  Neurological: Negative for dizziness or headache.  No other specific complaints in a complete review of systems (except  as listed in HPI above).    Objective  Vitals:   11/29/22 0810  BP: 116/72  Pulse: 90  Resp: 12  Temp: 97.9 F (36.6 C)  TempSrc: Oral  SpO2: 99%  Weight: 153 lb 4.8 oz (69.5 kg)  Height: 5\' 5"  (1.651 m)    Body mass index is 25.51 kg/m.  Physical Exam  Constitutional: Patient appears well-developed and well-nourished. No distress.  HENT: Head: Normocephalic and atraumatic. Ears: B TMs ok, no erythema or effusion; Nose: Nose normal. Mouth/Throat: Oropharynx is clear and moist. No oropharyngeal exudate.  Eyes: Conjunctivae and EOM are normal.  Pupils are equal, round, and reactive to light. No scleral icterus.  Neck: Normal range of motion. Neck supple. No JVD present. No thyromegaly present.  Cardiovascular: Normal rate, regular rhythm and normal heart sounds.  No murmur heard. No BLE edema. Pulmonary/Chest: Effort normal and breath sounds normal. No respiratory distress. Abdominal: Soft. Bowel sounds are normal, no distension. There is no tenderness. no masses MALE GENITALIA: Normal descended testes bilaterally, no masses palpated, no hernias, no lesions, no discharge  RECTAL: not done  Musculoskeletal: Normal range of motion, no joint effusions. No gross deformities Neurological: he is alert and oriented to person, place, and time. No cranial nerve deficit. Coordination, balance, strength, speech and gait are normal.  Skin: Skin is warm and dry. No rash noted. No erythema. Lipoma on posterior neck  Psychiatric: Patient has a normal mood and affect. behavior is normal. Judgment and thought content normal.   Recent Results (from the past 2160 hour(s))  PSA     Status: Abnormal   Collection Time: 10/03/22  8:13 AM  Result Value Ref Range   Prostate Specific Ag, Serum 6.1 (H) 0.0 - 4.0 ng/mL    Comment: Roche ECLIA methodology. According to the American Urological Association, Serum PSA should decrease and remain at undetectable levels after radical prostatectomy. The AUA defines biochemical recurrence as an initial PSA value 0.2 ng/mL or greater followed by a subsequent confirmatory PSA value 0.2 ng/mL or greater. Values obtained with different assay methods or kits cannot be used interchangeably. Results cannot be interpreted as absolute evidence of the presence or absence of malignant disease.      Fall Risk:    11/29/2022    8:13 AM 12/29/2021    8:18 AM 11/23/2021    8:21 AM 01/19/2021   10:11 AM 12/08/2020   10:55 AM  Fall Risk   Falls in the past year? 0 0 0 0 0  Number falls in past yr:  0  0 0  Injury with  Fall?  0  0 0  Risk for fall due to : No Fall Risks No Fall Risks No Fall Risks No Fall Risks No Fall Risks  Follow up Falls prevention discussed;Education provided;Falls evaluation completed Falls prevention discussed;Education provided;Falls evaluation completed Falls prevention discussed;Education provided;Falls evaluation completed Falls evaluation completed Falls evaluation completed     Functional Status Survey: Is the patient deaf or have difficulty hearing?: No Does the patient have difficulty seeing, even when wearing glasses/contacts?: No Does the patient have difficulty concentrating, remembering, or making decisions?: No Does the patient have difficulty walking or climbing stairs?: No Does the patient have difficulty dressing or bathing?: No Does the patient have difficulty doing errands alone such as visiting a doctor's office or shopping?: No  Diabetic Foot Exam - Simple   Simple Foot Form Visual Inspection No deformities, no ulcerations, no other skin breakdown bilaterally: Yes Sensation Testing Intact  to touch and monofilament testing bilaterally: Yes Pulse Check Posterior Tibialis and Dorsalis pulse intact bilaterally: Yes Comments      Assessment & Plan  1. Well adult exam  - Lipid panel - Microalbumin / creatinine urine ratio - CBC with Differential/Platelet - COMPLETE METABOLIC PANEL WITH GFR - VITAMIN D 25 Hydroxy (Vit-D Deficiency, Fractures) - Tdap vaccine greater than or equal to 7yo IM  2. Need for immunization against influenza  - Flu vaccine trivalent PF, 6mos and older(Flulaval,Afluria,Fluarix,Fluzone)  3. Dyslipidemia associated with type 2 diabetes mellitus (HCC)  - Lipid panel - Microalbumin / creatinine urine ratio - COMPLETE METABOLIC PANEL WITH GFR  4. Vitamin D deficiency  - VITAMIN D 25 Hydroxy (Vit-D Deficiency, Fractures)  5. Hypertension associated with stage 2 chronic kidney disease due to type 2 diabetes mellitus (HCC)  -  CBC with Differential/Platelet - COMPLETE METABOLIC PANEL WITH GFR  6. Need for Tdap vaccination  - Tdap vaccine greater than or equal to 7yo IM    -Prostate cancer screening and PSA options (with potential risks and benefits of testing vs not testing) were discussed along with recent recs/guidelines. -USPSTF grade A and B recommendations reviewed with patient; age-appropriate recommendations, preventive care, screening tests, etc discussed and encouraged; healthy living encouraged; see AVS for patient education given to patient -Discussed importance of 150 minutes of physical activity weekly, eat two servings of fish weekly, eat one serving of tree nuts ( cashews, pistachios, pecans, almonds.Marland Kitchen) every other day, eat 6 servings of fruit/vegetables daily and drink plenty of water and avoid sweet beverages.  -Reviewed Health Maintenance: yes

## 2022-11-29 ENCOUNTER — Encounter: Payer: Self-pay | Admitting: Family Medicine

## 2022-11-29 ENCOUNTER — Ambulatory Visit (INDEPENDENT_AMBULATORY_CARE_PROVIDER_SITE_OTHER): Payer: BC Managed Care – PPO | Admitting: Family Medicine

## 2022-11-29 VITALS — BP 116/72 | HR 90 | Temp 97.9°F | Resp 12 | Ht 65.0 in | Wt 153.3 lb

## 2022-11-29 DIAGNOSIS — I129 Hypertensive chronic kidney disease with stage 1 through stage 4 chronic kidney disease, or unspecified chronic kidney disease: Secondary | ICD-10-CM | POA: Diagnosis not present

## 2022-11-29 DIAGNOSIS — N182 Chronic kidney disease, stage 2 (mild): Secondary | ICD-10-CM

## 2022-11-29 DIAGNOSIS — Z23 Encounter for immunization: Secondary | ICD-10-CM | POA: Diagnosis not present

## 2022-11-29 DIAGNOSIS — E1122 Type 2 diabetes mellitus with diabetic chronic kidney disease: Secondary | ICD-10-CM | POA: Diagnosis not present

## 2022-11-29 DIAGNOSIS — Z0001 Encounter for general adult medical examination with abnormal findings: Secondary | ICD-10-CM | POA: Diagnosis not present

## 2022-11-29 DIAGNOSIS — E559 Vitamin D deficiency, unspecified: Secondary | ICD-10-CM

## 2022-11-29 DIAGNOSIS — E1169 Type 2 diabetes mellitus with other specified complication: Secondary | ICD-10-CM | POA: Diagnosis not present

## 2022-11-29 DIAGNOSIS — Z0000 Encounter for general adult medical examination without abnormal findings: Secondary | ICD-10-CM

## 2022-11-29 DIAGNOSIS — E785 Hyperlipidemia, unspecified: Secondary | ICD-10-CM

## 2022-11-30 LAB — COMPLETE METABOLIC PANEL WITH GFR
AG Ratio: 2.2 (calc) (ref 1.0–2.5)
ALT: 26 U/L (ref 9–46)
AST: 23 U/L (ref 10–35)
Albumin: 4.4 g/dL (ref 3.6–5.1)
Alkaline phosphatase (APISO): 62 U/L (ref 35–144)
BUN: 19 mg/dL (ref 7–25)
CO2: 31 mmol/L (ref 20–32)
Calcium: 9.6 mg/dL (ref 8.6–10.3)
Chloride: 103 mmol/L (ref 98–110)
Creat: 1.23 mg/dL (ref 0.70–1.30)
Globulin: 2 g/dL (ref 1.9–3.7)
Glucose, Bld: 111 mg/dL — ABNORMAL HIGH (ref 65–99)
Potassium: 4 mmol/L (ref 3.5–5.3)
Sodium: 140 mmol/L (ref 135–146)
Total Bilirubin: 1 mg/dL (ref 0.2–1.2)
Total Protein: 6.4 g/dL (ref 6.1–8.1)
eGFR: 68 mL/min/{1.73_m2} (ref >=60)

## 2022-11-30 LAB — LIPID PANEL
Cholesterol: 128 mg/dL (ref <200)
HDL: 54 mg/dL (ref >=40)
LDL Cholesterol (Calc): 61 mg/dL
Non-HDL Cholesterol (Calc): 74 mg/dL (ref <130)
Total CHOL/HDL Ratio: 2.4 (calc) (ref <5.0)
Triglycerides: 45 mg/dL (ref <150)

## 2022-11-30 LAB — CBC WITH DIFFERENTIAL/PLATELET
Absolute Lymphocytes: 1208 {cells}/uL (ref 850–3900)
Absolute Monocytes: 366 {cells}/uL (ref 200–950)
Basophils Absolute: 10 {cells}/uL (ref 0–200)
Basophils Relative: 0.3 %
Eosinophils Absolute: 73 {cells}/uL (ref 15–500)
Eosinophils Relative: 2.2 %
HCT: 48.5 % (ref 38.5–50.0)
Hemoglobin: 15.4 g/dL (ref 13.2–17.1)
MCH: 25.6 pg — ABNORMAL LOW (ref 27.0–33.0)
MCHC: 31.8 g/dL — ABNORMAL LOW (ref 32.0–36.0)
MCV: 80.6 fL (ref 80.0–100.0)
MPV: 12 fL (ref 7.5–12.5)
Monocytes Relative: 11.1 %
Neutro Abs: 1643 {cells}/uL (ref 1500–7800)
Neutrophils Relative %: 49.8 %
Platelets: 203 10*3/uL (ref 140–400)
RBC: 6.02 10*6/uL — ABNORMAL HIGH (ref 4.20–5.80)
RDW: 13.4 % (ref 11.0–15.0)
Total Lymphocyte: 36.6 %
WBC: 3.3 10*3/uL — ABNORMAL LOW (ref 3.8–10.8)

## 2022-11-30 LAB — MICROALBUMIN / CREATININE URINE RATIO
Creatinine, Urine: 101 mg/dL (ref 20–320)
Microalb Creat Ratio: 3 mg/g{creat} (ref <30)
Microalb, Ur: 0.3 mg/dL

## 2022-11-30 LAB — VITAMIN D 25 HYDROXY (VIT D DEFICIENCY, FRACTURES): Vit D, 25-Hydroxy: 96 ng/mL (ref 30–100)

## 2023-01-02 ENCOUNTER — Other Ambulatory Visit: Payer: Self-pay | Admitting: *Deleted

## 2023-01-02 DIAGNOSIS — R972 Elevated prostate specific antigen [PSA]: Secondary | ICD-10-CM

## 2023-01-03 ENCOUNTER — Other Ambulatory Visit: Payer: BC Managed Care – PPO

## 2023-01-03 DIAGNOSIS — R972 Elevated prostate specific antigen [PSA]: Secondary | ICD-10-CM

## 2023-01-04 ENCOUNTER — Encounter: Payer: Self-pay | Admitting: *Deleted

## 2023-01-04 ENCOUNTER — Other Ambulatory Visit: Payer: Self-pay | Admitting: *Deleted

## 2023-01-04 DIAGNOSIS — R972 Elevated prostate specific antigen [PSA]: Secondary | ICD-10-CM

## 2023-01-04 LAB — PSA: Prostate Specific Ag, Serum: 7.4 ng/mL — ABNORMAL HIGH (ref 0.0–4.0)

## 2023-01-16 ENCOUNTER — Encounter: Payer: Self-pay | Admitting: Family Medicine

## 2023-01-16 NOTE — Telephone Encounter (Signed)
 Care team updated and letter sent for eye exam notes.

## 2023-01-22 ENCOUNTER — Ambulatory Visit
Admission: RE | Admit: 2023-01-22 | Discharge: 2023-01-22 | Disposition: A | Discharge status: Home / Self Care | Payer: BC Managed Care – PPO | Source: Ambulatory Visit | Attending: Urology | Admitting: Urology

## 2023-01-22 DIAGNOSIS — R972 Elevated prostate specific antigen [PSA]: Secondary | ICD-10-CM | POA: Insufficient documentation

## 2023-01-22 DIAGNOSIS — N400 Enlarged prostate without lower urinary tract symptoms: Secondary | ICD-10-CM | POA: Diagnosis not present

## 2023-01-22 DIAGNOSIS — N4289 Other specified disorders of prostate: Secondary | ICD-10-CM | POA: Diagnosis not present

## 2023-01-22 MED ORDER — GADOBUTROL 1 MMOL/ML IV SOLN
7.0000 mL | Freq: Once | INTRAVENOUS | Status: AC | PRN
  Administered 2023-01-22: 7 mL via INTRAVENOUS

## 2023-01-25 DIAGNOSIS — E1159 Type 2 diabetes mellitus with other circulatory complications: Secondary | ICD-10-CM | POA: Diagnosis not present

## 2023-01-25 DIAGNOSIS — E673 Hypervitaminosis D: Secondary | ICD-10-CM | POA: Diagnosis not present

## 2023-01-25 DIAGNOSIS — E113299 Type 2 diabetes mellitus with mild nonproliferative diabetic retinopathy without macular edema, unspecified eye: Secondary | ICD-10-CM | POA: Diagnosis not present

## 2023-01-25 DIAGNOSIS — Z794 Long term (current) use of insulin: Secondary | ICD-10-CM | POA: Diagnosis not present

## 2023-01-25 DIAGNOSIS — E1169 Type 2 diabetes mellitus with other specified complication: Secondary | ICD-10-CM | POA: Diagnosis not present

## 2023-03-21 ENCOUNTER — Other Ambulatory Visit: Payer: Self-pay | Admitting: Urology

## 2023-03-29 ENCOUNTER — Ambulatory Visit: Payer: Self-pay | Admitting: Urology

## 2023-05-15 NOTE — Patient Instructions (Signed)
 Transrectal Prostate Biopsy/Fusion Biopsy Patient Education and Post Procedure Instructions    -Definition A prostate biopsy is the removal of a small amount of tissue from the prostate gland. The tissue is examined to determine whether there is cancer.  -Reasons for Procedure A prostate biopsy is usually done after an abnormal finding by: Digital rectal exam Prostate specific antigen (PSA) blood test A prostate biopsy is the only way to find out if cancer cells are present.  -Possible Complications Problems from the procedure are rare, but all procedures have some risk including: Infection Bruising or lengthy bleeding from the rectum, or in urine or semen Difficulty urinating Reactions to anesthesia Factors that may increase the risk of complications include: Smoking History of bleeding disorders or easy bruising Use of any medications, over-the-counter medications, or herbal supplements Sensitivity or allergy to latex, medications, or anesthesia.  -Prior to Procedure Talk to your doctor about your medications. Blood thinning medications including aspirin should be stopped 1 week prior to procedure. If prescribed by your cardiologist we may need approval before stopping medications. Use a Fleets enema 2 hours before the procedure. Can be purchased at your pharmacy. Antibiotics will be administered in the clinic prior to procedure.  Please make sure you eat a light meal prior to coming in for your appointment. This can help prevent lightheadedness during the procedure and upset stomach from antibiotics. Please bring someone with you to the procedure to drive you home.  -Anesthesia Transrectal biopsy: Local anesthesia--Just the area that is being operated on is numbed using an injectable anesthetic.  -Description of the Procedure Transrectal biopsy--Your doctor will insert a small ultrasound device into the rectum. This device will produce sound waves to create an image of the  prostate. These images will help guide placement of the needle. Your doctor will then insert the needle through the wall of the rectum and into the prostate gland. The procedure should take approximately 15-30 minutes.  -Will It Hurt? You may have discomfort and soreness at the biopsy site. Pain and discomfort after the procedure can be managed with medications.  -Postoperative Care When you return home after the procedure, do the following to help ensure a smooth recovery: Stay hydrated. Drink plenty of fluids for the next few days. Avoid difficult physical activity the day and evening of the procedure. Keep in mind that you may see blood in your urine, stool, or semen for several days. Resume any medications that were stopped when you are advised to do so.  After the sample is taken, it will be sent to a pathologist for examination under a microscope. This doctor will analyze the sample for cancer. You will be scheduled for an appointment to discuss results. If cancer is present, your doctor will work with you to develop a treatment plan.   -Call Your Doctor or Seek Immediate Medical Attention It is important to monitor your recovery. Alert your doctor to any problems. If any of the following occur, call your doctor or go to the emergency room: Fever 100.5 or greater within 1 week post procedure go directly to ER Call the office for: Blood in the urine more than 1 week or in semen for more than 6 weeks post-biopsy Pain that you cannot control with the medications you have been given Pain, burning, urgency, or frequency of urination Cough, shortness of breath, or chest pain- if severe go to ER Heavy rectal bleeding or bleeding that lasts more than 1 week after the biopsy If you have  any questions or concerns please contact our office at [facility]  [facility] [place] [address] [place], [place] [address]  [phone]

## 2023-05-16 ENCOUNTER — Ambulatory Visit: Payer: BC Managed Care – PPO | Admitting: Urology

## 2023-05-16 VITALS — BP 128/71 | HR 58 | Ht 65.0 in

## 2023-05-16 DIAGNOSIS — Z2989 Encounter for other specified prophylactic measures: Secondary | ICD-10-CM | POA: Diagnosis not present

## 2023-05-16 DIAGNOSIS — Z792 Long term (current) use of antibiotics: Secondary | ICD-10-CM

## 2023-05-16 DIAGNOSIS — R972 Elevated prostate specific antigen [PSA]: Secondary | ICD-10-CM

## 2023-05-16 MED ORDER — GENTAMICIN SULFATE 40 MG/ML IJ SOLN
80.0000 mg | Freq: Once | INTRAMUSCULAR | Status: AC
  Administered 2023-05-16: 80 mg via INTRAMUSCULAR

## 2023-05-16 MED ORDER — LEVOFLOXACIN 500 MG PO TABS
500.0000 mg | ORAL_TABLET | Freq: Once | ORAL | Status: AC
  Administered 2023-05-16: 500 mg via ORAL

## 2023-05-16 NOTE — Progress Notes (Signed)
   05/16/23  Indication: Elevated PSA, 7.4  MRI Fusion Prostate Biopsy Procedure   Informed consent was obtained, and we discussed the risks of bleeding and infection/sepsis. A time out was performed to ensure correct patient identity.  Pre-Procedure: - Last PSA Level: 7.4 - Gentamicin and levaquin given for antibiotic prophylaxis -Prostate measured 26 g on MRI, PSA density 0.28   Procedure: - Prostate block performed using 10 cc 1% lidocaine  - MRI fusion biopsy was performed, and 3 biopsies were taken from the ROI#1 PIRADS 3 lesion located right anterior transition zone in the base and mid gland - Standard biopsies taken from sextant areas, 12 under ultrasound guidance. - Total of 15 cores taken  Post-Procedure: - Patient tolerated the procedure well - He was counseled to seek immediate medical attention if experiences significant bleeding, fevers, or severe pain - Return in one week to discuss biopsy results  Assessment/ Plan: Will follow up in 1-2 weeks to discuss pathology with Dr. Derald Flattery, MD 05/16/2023

## 2023-05-24 DIAGNOSIS — E1169 Type 2 diabetes mellitus with other specified complication: Secondary | ICD-10-CM | POA: Diagnosis not present

## 2023-05-24 DIAGNOSIS — E1159 Type 2 diabetes mellitus with other circulatory complications: Secondary | ICD-10-CM | POA: Diagnosis not present

## 2023-05-24 DIAGNOSIS — E113299 Type 2 diabetes mellitus with mild nonproliferative diabetic retinopathy without macular edema, unspecified eye: Secondary | ICD-10-CM | POA: Diagnosis not present

## 2023-05-24 DIAGNOSIS — Z794 Long term (current) use of insulin: Secondary | ICD-10-CM | POA: Diagnosis not present

## 2023-05-30 ENCOUNTER — Ambulatory Visit: Payer: BC Managed Care – PPO | Admitting: Urology

## 2023-05-30 ENCOUNTER — Encounter: Payer: Self-pay | Admitting: Urology

## 2023-05-30 VITALS — BP 136/71 | HR 89 | Ht 65.0 in | Wt 149.0 lb

## 2023-05-30 DIAGNOSIS — R972 Elevated prostate specific antigen [PSA]: Secondary | ICD-10-CM | POA: Diagnosis not present

## 2023-05-30 NOTE — Progress Notes (Signed)
 I, Maysun Jamey Mccallum, acting as a scribe for Geraline Knapp, MD., have documented all relevant documentation on the behalf of Geraline Knapp, MD, as directed by Geraline Knapp, MD while in the presence of Geraline Knapp, MD.  05/30/2023 3:50 PM   Loma Rising 07-07-1965 [id number]  Referring provider: Arleen Lacer, MD [address] [place],  [place] [address]  Chief Complaint  Patient presents with   Elevated PSA   Urologic history: 1.  Elevated PSA -Prostate biopsy 12/2016; PSA 5.5; volume 33 g; benign pathology  HPI: Jack Cardenas is a 58 y.o. male presents for prostate biopsy follow-up.  MRI fusion biopsy performed 05/16/23 for PSA of 7.4. An MRI showing a PI-RADS 3 lesion right anterior transition zone at the base/midgland.  He underwent standard 12-core biopsies+3 ROI cores.  No post-biopsy complaints Pathology: 15/15 cores show benign prostate tissue.  PSA trend   Prostate Specific Ag, Serum  Latest Ref Rng 0.0 - 4.0 ng/mL  09/20/2018 2.7   09/24/2019 2.4   09/23/2020 2.3   09/26/2021 3.5   10/03/2022 6.1 (H)   01/03/2023 7.4 (H)     PMH: Past Medical History:  Diagnosis Date   Diabetes mellitus without complication (HCC)    Hyperlipidemia    Hypertension    MVP (mitral valve prolapse)    Dx'd as teenager. No issues.  Followed by PCP.    Surgical History: Past Surgical History:  Procedure Laterality Date   COLONOSCOPY WITH PROPOFOL  N/A 12/11/2014   Procedure: COLONOSCOPY WITH PROPOFOL ;  Surgeon: Marnee Sink, MD;  Location: [facility];  Service: Endoscopy;  Laterality: N/A;  Diabetic - insulin    Home Medications:  Allergies as of 05/30/2023   No Known Allergies      Medication List        Accurate as of May 30, 2023  3:50 PM. If you have any questions, ask your nurse or doctor.          amLODipine 5 MG tablet Commonly known as: NORVASC Take 5 mg by mouth daily.   aspirin  EC 81 MG tablet Take 81 mg by mouth daily.    atorvastatin 40 MG tablet Commonly known as: LIPITOR Take 40 mg by mouth daily.   Cholecalciferol 50 MCG (2000 UT) Caps Take 2,000 Units by mouth daily.   empagliflozin 25 MG Tabs tablet Commonly known as: JARDIANCE Take 1 tablet by mouth daily.   ezetimibe 10 MG tablet Commonly known as: ZETIA Take 1 tablet once a day for cholesterol   FISH OIL PO Take 2 capsules by mouth daily.   Insulin Pen Needle 32G X 4 MM Misc Use as directed for insulin injection once a day   Mounjaro 5 MG/0.5ML Pen Generic drug: tirzepatide Inject into the skin.   multivitamin tablet Take 1 tablet by mouth daily.   OneTouch Verio test strip Generic drug: glucose blood 1 each by Other route as needed.   Contour Next Test test strip Generic drug: glucose blood Use as instructed   ramipril 10 MG capsule Commonly known as: ALTACE Take 10 mg by mouth daily.        Allergies: No Known Allergies  Family History: Family History  Problem Relation Age of Onset   Renal Disease Father    Diabetes Father    Thyroid  disease Sister    Metabolic syndrome Sister    Eczema Sister    Prostate cancer Paternal Grandfather    Colon cancer Paternal Grandfather  Social History:  reports that he has never smoked. He has never used smokeless tobacco. He reports that he does not currently use alcohol. He reports that he does not use drugs.   Physical Exam: BP 136/71   Pulse 89   Ht 5\' 5"  (1.651 m)   Wt 149 lb (67.6 kg)   BMI 24.79 kg/m   Constitutional:  Alert and oriented, No acute distress. HEENT: [place] AT Respiratory: Normal respiratory effort, no increased work of breathing. Psychiatric: Normal mood and affect.   Assessment & Plan:    1. Elevated PSA Benign MR fusion biopsy.  Continue monitoring and will schedule a lab visit 6 months for PSA and 1 year office visit.  I have reviewed the above documentation for accuracy and completeness, and I agree with the above.   Geraline Knapp, MD  [facility] [address], [address] [place], [place] [address] [phone]

## 2023-06-29 DIAGNOSIS — H2513 Age-related nuclear cataract, bilateral: Secondary | ICD-10-CM | POA: Diagnosis not present

## 2023-06-29 DIAGNOSIS — H25013 Cortical age-related cataract, bilateral: Secondary | ICD-10-CM | POA: Diagnosis not present

## 2023-06-29 DIAGNOSIS — E113293 Type 2 diabetes mellitus with mild nonproliferative diabetic retinopathy without macular edema, bilateral: Secondary | ICD-10-CM | POA: Diagnosis not present

## 2023-06-29 LAB — HM DIABETES EYE EXAM

## 2023-07-03 ENCOUNTER — Encounter: Payer: Self-pay | Admitting: Family Medicine

## 2023-11-27 ENCOUNTER — Other Ambulatory Visit: Payer: Self-pay

## 2023-11-27 DIAGNOSIS — R972 Elevated prostate specific antigen [PSA]: Secondary | ICD-10-CM

## 2023-11-27 DIAGNOSIS — Z87898 Personal history of other specified conditions: Secondary | ICD-10-CM

## 2023-11-28 DIAGNOSIS — E113299 Type 2 diabetes mellitus with mild nonproliferative diabetic retinopathy without macular edema, unspecified eye: Secondary | ICD-10-CM | POA: Diagnosis not present

## 2023-11-28 DIAGNOSIS — E1169 Type 2 diabetes mellitus with other specified complication: Secondary | ICD-10-CM | POA: Diagnosis not present

## 2023-11-28 DIAGNOSIS — E1159 Type 2 diabetes mellitus with other circulatory complications: Secondary | ICD-10-CM | POA: Diagnosis not present

## 2023-11-28 DIAGNOSIS — Z794 Long term (current) use of insulin: Secondary | ICD-10-CM | POA: Diagnosis not present

## 2023-11-28 DIAGNOSIS — E673 Hypervitaminosis D: Secondary | ICD-10-CM | POA: Diagnosis not present

## 2023-11-29 ENCOUNTER — Other Ambulatory Visit

## 2023-11-29 DIAGNOSIS — R972 Elevated prostate specific antigen [PSA]: Secondary | ICD-10-CM | POA: Diagnosis not present

## 2023-11-29 DIAGNOSIS — Z87898 Personal history of other specified conditions: Secondary | ICD-10-CM | POA: Diagnosis not present

## 2023-11-30 ENCOUNTER — Encounter: Payer: Self-pay | Admitting: Family Medicine

## 2023-11-30 ENCOUNTER — Ambulatory Visit: Payer: Self-pay | Admitting: Family Medicine

## 2023-11-30 VITALS — BP 138/84 | HR 68 | Temp 98.0°F | Ht 65.0 in | Wt 151.0 lb

## 2023-11-30 DIAGNOSIS — E1169 Type 2 diabetes mellitus with other specified complication: Secondary | ICD-10-CM

## 2023-11-30 DIAGNOSIS — E559 Vitamin D deficiency, unspecified: Secondary | ICD-10-CM

## 2023-11-30 DIAGNOSIS — E785 Hyperlipidemia, unspecified: Secondary | ICD-10-CM | POA: Diagnosis not present

## 2023-11-30 DIAGNOSIS — N182 Chronic kidney disease, stage 2 (mild): Secondary | ICD-10-CM

## 2023-11-30 DIAGNOSIS — Z23 Encounter for immunization: Secondary | ICD-10-CM | POA: Diagnosis not present

## 2023-11-30 DIAGNOSIS — Z0000 Encounter for general adult medical examination without abnormal findings: Secondary | ICD-10-CM | POA: Diagnosis not present

## 2023-11-30 DIAGNOSIS — I129 Hypertensive chronic kidney disease with stage 1 through stage 4 chronic kidney disease, or unspecified chronic kidney disease: Secondary | ICD-10-CM

## 2023-11-30 DIAGNOSIS — Z1159 Encounter for screening for other viral diseases: Secondary | ICD-10-CM

## 2023-11-30 DIAGNOSIS — E1122 Type 2 diabetes mellitus with diabetic chronic kidney disease: Secondary | ICD-10-CM

## 2023-11-30 LAB — PSA: Prostate Specific Ag, Serum: 2.8 ng/mL (ref 0.0–4.0)

## 2023-11-30 MED ORDER — PIOGLITAZONE HCL 15 MG PO TABS: 15.0000 mg | ORAL_TABLET | Freq: Every day | ORAL | 1 refills | Status: AC

## 2023-11-30 MED ORDER — AMLODIPINE BESYLATE-VALSARTAN 5-160 MG PO TABS: 1.0000 | ORAL_TABLET | Freq: Every day | ORAL | 3 refills | Status: AC

## 2023-11-30 MED ORDER — ATORVASTATIN CALCIUM 40 MG PO TABS: 40.0000 mg | ORAL_TABLET | Freq: Every day | ORAL | 3 refills | Status: AC

## 2023-11-30 MED ORDER — EZETIMIBE 10 MG PO TABS: 10.0000 mg | ORAL_TABLET | Freq: Every day | ORAL | 3 refills | Status: AC

## 2023-11-30 NOTE — Progress Notes (Signed)
 Name: Jack Cardenas   MRN: [id number]    DOB: [birth year]   Date:11/30/2023       Progress Note  Subjective  Chief Complaint  Chief Complaint  Patient presents with   Annual Exam    Pt has no concerns at this time.     HPI  Patient presents for annual CPE .  Discussed the use of AI scribe software for clinical note transcription with the patient, who gave verbal consent to proceed.  History of Present Illness Jack Cardenas is a 58 year old male with diabetes who presents for an annual physical exam.  He has diabetes type 2  and notes that his finger prick blood glucose levels have been low, averaging around 130 mg/dL, with occasional readings in the high 90s. His HbA1c remains at 7.4%, unchanged from previous measurements, leading to confusion over the discrepancy between his daily glucose readings and his HbA1c levels. He is currently on Mounjaro 0.5 mg  and has lost about 25 pounds since starting the medication, which he attributes to dietary changes and an exercise routine. He is resistant to increasing his Mounjaro dose due to concerns about further weight loss, he is also taking Jardiance. No issues with hunger, excessive thirst, or frequent urination.  He follows a high-protein, low-carbohydrate diet and frequently consumes peanut butter. He exercises regularly, going to the gym four to five days a week, and engages in full-day activities on Saturdays. Despite his active lifestyle, he has stopped doing cardio to prevent further weight loss.  He is on several medications including amlodipine and altace  for blood pressure, atorvastatin for cholesterol, and Mounajaro and Jardiance for diabetes. He also takes ezetimibe and has been on vitamin D  supplements in the past, with his wife managing his vitamin D  dosage. No chest pain, palpitations, or muscle pain associated with his medications.  He reports that his PSA was previously elevated, and he has undergone two biopsies with no  significant findings. He has a history of high vitamin D  levels after supplementation, which is now being monitored. He has not experienced any erectile dysfunction and reports a normal sexual life.  He has a lipoma that does not cause discomfort or functional issues. He also mentions having pinched nerves in his neck, leading to some weakness in his right arm, but he manages this with specific exercises and adjustments in his workout routine.      AUA done by urologist   Diet: he avoids carbohydrates, eating a high protein diet  Exercise: he goes to the gym on a regular basis  Last Dental Exam:  Last Eye Exam:   Depression: phq 9 is negative    11/30/2023    8:14 AM 11/29/2022    8:11 AM 12/29/2021    8:18 AM 11/23/2021    8:21 AM 11/23/2021    8:20 AM  Depression screen PHQ 2/9  Decreased Interest 0 0 0 0 0  Down, Depressed, Hopeless 0 0 0 0 0  PHQ - 2 Score 0 0 0 0 0  Altered sleeping  0 0 0   Tired, decreased energy  0 0 0   Change in appetite  0 0 0   Feeling bad or failure about yourself   0 0 0   Trouble concentrating  0 0 0   Moving slowly or fidgety/restless  0 0 0   Suicidal thoughts  0 0 0   PHQ-9 Score  0 0 0   Difficult doing work/chores   Not  difficult at all      Hypertension:  BP Readings from Last 3 Encounters:  11/30/23 138/84  05/30/23 136/71  05/16/23 128/71    Obesity: Wt Readings from Last 3 Encounters:  11/30/23 151 lb (68.5 kg)  05/30/23 149 lb (67.6 kg)  11/29/22 153 lb 4.8 oz (69.5 kg)   BMI Readings from Last 3 Encounters:  11/30/23 25.13 kg/m  05/30/23 24.79 kg/m  05/16/23 25.51 kg/m     [employer] Visit from 11/30/2023 [facility]  AUDIT-C Score 0     Married STD testing and prevention (HIV/chl/gon/syphilis):  not applicable Sexual history: one partner , no problems  Hep C Screening: completed Skin cancer: Discussed monitoring for atypical lesions Colorectal cancer: repeat  2026 Prostate cancer:  yes, under the care of Urologist and last PSA normalized    Lung cancer:  Low Dose CT Chest recommended if Age 68-80 years, 30 pack-year currently smoking OR have quit w/in 15years. Patient  is not a candidate for screening   AAA: The USPSTF recommends one-time screening with ultrasonography in men ages 8 to 75 years who have ever smoked. Patient   is not a candidate for screening  ECG:  2017  Vaccines: reviewed with the patient.   Advanced Care Planning: A voluntary discussion about advance care planning including the explanation and discussion of advance directives.  Discussed health care proxy and Living will, and the patient was able to identify a health care proxy as wife .  Patient does not have a living will and power of attorney of health care   Patient Active Problem List   Diagnosis Date Noted   Cervical spondylosis with radiculopathy 12/08/2020   Cervical radiculitis 12/08/2020   BPH without obstruction/lower urinary tract symptoms 09/26/2019   History of elevated PSA 09/24/2018   Dyslipidemia associated with type 2 diabetes mellitus (HCC)    Mixed dyslipidemia 03/03/2016   Hypertension, essential 03/03/2016   Vitamin D  deficiency 03/03/2016   Hypertension associated with stage 2 chronic kidney disease due to type 2 diabetes mellitus (HCC) 03/03/2016    Past Surgical History:  Procedure Laterality Date   COLONOSCOPY WITH PROPOFOL  N/A 12/11/2014   Procedure: COLONOSCOPY WITH PROPOFOL ;  Surgeon: Rogelia Copping, MD;  Location: [facility];  Service: Endoscopy;  Laterality: N/A;  Diabetic - insulin    Family History  Problem Relation Age of Onset   Renal Disease Father    Diabetes Father    Thyroid  disease Sister    Metabolic syndrome Sister    Eczema Sister    Prostate cancer Paternal Grandfather    Colon cancer Paternal Grandfather     Social History   Socioeconomic History   Marital status: Married    Spouse name: Breven Guidroz    Number of children: 2   Years of education: 16   Highest education level: Bachelor's degree (e.g., BA, AB, BS)  Occupational History   Occupation: estimator     Comment: [employer]   Tobacco Use   Smoking status: Never   Smokeless tobacco: Never  Vaping Use   Vaping status: Never Used  Substance and Sexual Activity   Alcohol use: Not Currently   Drug use: Never   Sexual activity: Yes    Partners: Female    Birth control/protection: None  Other Topics Concern   Not on file  Social History Narrative   Not on file   Social Drivers of Health   Financial Resource Strain: Low Risk  (  11/27/2023)   Overall Financial Resource Strain (CARDIA)    Difficulty of Paying Living Expenses: Not hard at all  Food Insecurity: No Food Insecurity (11/27/2023)   Hunger Vital Sign    Worried About Running Out of Food in the Last Year: Never true    Ran Out of Food in the Last Year: Never true  Transportation Needs: No Transportation Needs (11/27/2023)   PRAPARE - Administrator, Civil Service (Medical): No    Lack of Transportation (Non-Medical): No  Physical Activity: Sufficiently Active (11/27/2023)   Exercise Vital Sign    Days of Exercise per Week: 5 days    Minutes of Exercise per Session: 60 min  Stress: No Stress Concern Present (11/27/2023)   [employer] of Occupational Health - Occupational Stress Questionnaire    Feeling of Stress: Not at all  Social Connections: Socially Integrated (11/27/2023)   Social Connection and Isolation Panel    Frequency of Communication with Friends and Family: More than three times a week    Frequency of Social Gatherings with Friends and Family: Twice a week    Attends Religious Services: More than 4 times per year    Active Member of [employer] or Organizations: Yes    Attends Engineer, Structural: More than 4 times per year    Marital Status: Married  Catering Manager Violence: Not At Risk (11/30/2023)   Humiliation,  Afraid, Rape, and Kick questionnaire    Fear of Current or Ex-Partner: No    Emotionally Abused: No    Physically Abused: No    Sexually Abused: No     Current Outpatient Medications:    amLODipine-valsartan (EXFORGE) 5-160 MG tablet, Take 1 tablet by mouth daily., Disp: 90 tablet, Rfl: 3   aspirin  81 MG EC tablet, Take 81 mg by mouth daily., Disp: , Rfl:    Cholecalciferol 50 MCG (2000 UT) CAPS, Take 2,000 Units by mouth daily., Disp: , Rfl:    empagliflozin (JARDIANCE) 25 MG TABS tablet, Take 1 tablet by mouth daily., Disp: , Rfl:    glucose blood (CONTOUR NEXT TEST) test strip, Use as instructed, Disp: 200 each, Rfl: 0   Insulin Pen Needle 32G X 4 MM MISC, Use as directed for insulin injection once a day, Disp: , Rfl:    Multiple Vitamin (MULTIVITAMIN) tablet, Take 1 tablet by mouth daily., Disp: , Rfl:    Omega-3 Fatty Acids (FISH OIL PO), Take 2 capsules by mouth daily., Disp: , Rfl:    ONETOUCH VERIO test strip, 1 each by Other route as needed., Disp: , Rfl:    pioglitazone (ACTOS) 15 MG tablet, Take 1 tablet (15 mg total) by mouth daily., Disp: 90 tablet, Rfl: 1   tirzepatide (MOUNJARO) 5 MG/0.5ML Pen, Inject into the skin., Disp: , Rfl:    atorvastatin (LIPITOR) 40 MG tablet, Take 1 tablet (40 mg total) by mouth daily., Disp: 90 tablet, Rfl: 3   ezetimibe (ZETIA) 10 MG tablet, Take 1 tablet (10 mg total) by mouth daily., Disp: 90 tablet, Rfl: 3  No Known Allergies   ROS  Constitutional: Negative for fever or weight change.  Respiratory: Negative for cough and shortness of breath.   Cardiovascular: Negative for chest pain or palpitations.  Gastrointestinal: Negative for abdominal pain, no bowel changes.  Musculoskeletal: Negative for gait problem or joint swelling.  Skin: Negative for rash.  Neurological: Negative for dizziness or headache.  No other specific complaints in a complete review of systems (except as listed  in HPI above).    Objective  Vitals:   11/30/23  0809  BP: 138/84  Pulse: 68  Temp: 98 F (36.7 C)  SpO2: 97%  Weight: 151 lb (68.5 kg)  Height: 5' 5 (1.651 m)    Body mass index is 25.13 kg/m.  Physical Exam  Constitutional: Patient appears well-developed and well-nourished. No distress.  HENT: Head: Normocephalic and atraumatic. Ears: B TMs ok, no erythema or effusion; Nose: Nose normal. Mouth/Throat: Oropharynx is clear and moist. No oropharyngeal exudate.  Eyes: Conjunctivae and EOM are normal. Pupils are equal, round, and reactive to light. No scleral icterus.  Neck: Normal range of motion. Neck supple. No JVD present. No thyromegaly present.  Cardiovascular: Normal rate, regular rhythm and normal heart sounds.  No murmur heard. No BLE edema. Pulmonary/Chest: Effort normal and breath sounds normal. No respiratory distress. Abdominal: Soft. Bowel sounds are normal, no distension. There is no tenderness. no masses MALE GENITALIA: Normal descended testes bilaterally, no masses palpated, no hernias, no lesions, no discharge RECTAL: not done  Musculoskeletal: Normal range of motion, no joint effusions. absent right pectoralis muscle Neurological: he is alert and oriented to person, place, and time. No cranial nerve deficit. Coordination, balance, strength, speech and gait are normal.  Skin: Skin is warm and dry. No rash noted. No erythema. Soft mass on nuchal area   Psychiatric: Patient has a normal mood and affect. behavior is normal. Judgment and thought content normal.     Assessment & Plan Type 2 diabetes mellitus with associated HTN and Dyslipdiemia Type 2 diabetes with A1c at 7.4%. Home glucose readings average 130 mg/dL. Fructosamine level at 298. On Mounjaro and Jardiance. - Add pioglitazone to regimen. Discussed potential side effects, including weight gain. - Monitor blood sugar levels. - Consult with Dr. Cherilyn regarding pioglitazone addition. - Regular follow-up with Dr. Cherilyn .  Hypertension Blood  pressure at 138/84 mmHg. On amlodipine and ramipril. - Switch to amlodipine and valsartan combo 5/160 mg . Reminded him of  potential side effects, including cough and angioedema. - Monitor blood pressure regularly. - Educate on valsartan side effects.  Hyperlipidemia Managed with atorvastatin and ezetimibe. No muscle pain reported. - Continue atorvastatin and ezetimibe. - Provide 90-day supply with 3 refills.  Vitamin D  deficiency Previously high levels after supplementation. Currently on reduced dose. - Check vitamin D  levels. - Adjust supplementation as needed.  Adult Wellness Visit Weight at 151 lbs, BMI 25.13. Blood pressure 138/84 mmHg. Discussed high protein diet and avoiding high sugar foods. Encouraged cardiovascular exercise. - Perform blood work: cholesterol, vitamin D , liver enzymes, CBC, CMP. - Check vitamin D  levels. - Perform EKG due to diabetes history. - Discuss high protein diet and avoiding high sugar foods. - Encourage cardiovascular exercise.  Right-sided cervical radiculopathy No pain or weakness. Engages in regular exercise and stretching. - Continue regular exercise and stretching.  Lipoma Lipoma nuchal area  present without symptoms or discomfort. - Monitor for changes in size or symptoms.  General Health Maintenance Discussed screenings and vaccinations. Last PSA normal at 2.8. Colorectal cancer screening due November 2026. No lung cancer screening needed due to non-smoking status. Discussed living will and advance directives. - Schedule colorectal cancer screening for November 2026. - Discuss and document living will and advance directives. - Check hepatitis B vaccination status.         -Prostate cancer screening and PSA options (with potential risks and benefits of testing vs not testing) were discussed along with recent recs/guidelines. -USPSTF grade  A and B recommendations reviewed with patient; age-appropriate recommendations, preventive  care, screening tests, etc discussed and encouraged; healthy living encouraged; see AVS for patient education given to patient -Discussed importance of 150 minutes of physical activity weekly, eat two servings of fish weekly, eat one serving of tree nuts ( cashews, pistachios, pecans, almonds.SABRA) every other day, eat 6 servings of fruit/vegetables daily and drink plenty of water  and avoid sweet beverages.  -Reviewed Health Maintenance: yes

## 2023-12-01 LAB — LIPID PANEL
Cholesterol: 117 mg/dL (ref <200)
HDL: 59 mg/dL (ref >=40)
LDL Cholesterol (Calc): 47 mg/dL
Non-HDL Cholesterol (Calc): 58 mg/dL (ref <130)
Total CHOL/HDL Ratio: 2 (calc) (ref <5.0)
Triglycerides: 38 mg/dL (ref <150)

## 2023-12-01 LAB — CBC WITH DIFFERENTIAL/PLATELET
Absolute Lymphocytes: 1234 {cells}/uL (ref 850–3900)
Absolute Monocytes: 316 {cells}/uL (ref 200–950)
Basophils Absolute: 20 {cells}/uL (ref 0–200)
Basophils Relative: 0.6 %
Eosinophils Absolute: 58 {cells}/uL (ref 15–500)
Eosinophils Relative: 1.7 %
HCT: 49.8 % (ref 38.5–50.0)
Hemoglobin: 15.5 g/dL (ref 13.2–17.1)
MCH: 25.5 pg — ABNORMAL LOW (ref 27.0–33.0)
MCHC: 31.1 g/dL — ABNORMAL LOW (ref 32.0–36.0)
MCV: 82 fL (ref 80.0–100.0)
MPV: 12.1 fL (ref 7.5–12.5)
Monocytes Relative: 9.3 %
Neutro Abs: 1771 {cells}/uL (ref 1500–7800)
Neutrophils Relative %: 52.1 %
Platelets: 202 Thousand/uL (ref 140–400)
RBC: 6.07 Million/uL — ABNORMAL HIGH (ref 4.20–5.80)
RDW: 13.7 % (ref 11.0–15.0)
Total Lymphocyte: 36.3 %
WBC: 3.4 Thousand/uL — ABNORMAL LOW (ref 3.8–10.8)

## 2023-12-01 LAB — COMPREHENSIVE METABOLIC PANEL WITH GFR
AG Ratio: 2.2 (calc) (ref 1.0–2.5)
ALT: 28 U/L (ref 9–46)
AST: 24 U/L (ref 10–35)
Albumin: 4.6 g/dL (ref 3.6–5.1)
Alkaline phosphatase (APISO): 66 U/L (ref 35–144)
BUN: 17 mg/dL (ref 7–25)
CO2: 30 mmol/L (ref 20–32)
Calcium: 9.4 mg/dL (ref 8.6–10.3)
Chloride: 102 mmol/L (ref 98–110)
Creat: 1.11 mg/dL (ref 0.70–1.30)
Globulin: 2.1 g/dL (ref 1.9–3.7)
Glucose, Bld: 100 mg/dL — ABNORMAL HIGH (ref 65–99)
Potassium: 4.2 mmol/L (ref 3.5–5.3)
Sodium: 140 mmol/L (ref 135–146)
Total Bilirubin: 1 mg/dL (ref 0.2–1.2)
Total Protein: 6.7 g/dL (ref 6.1–8.1)
eGFR: 76 mL/min/1.73m2 (ref >=60)

## 2023-12-01 LAB — VITAMIN D 25 HYDROXY (VIT D DEFICIENCY, FRACTURES): Vit D, 25-Hydroxy: 84 ng/mL (ref 30–100)

## 2023-12-01 LAB — EXTRA

## 2023-12-01 LAB — HEPATITIS B SURFACE ANTIBODY,QUALITATIVE: Hep B S Ab: NONREACTIVE

## 2023-12-01 LAB — MICROALBUMIN / CREATININE URINE RATIO
Creatinine, Urine: 78 mg/dL (ref 20–320)
Microalb Creat Ratio: 3 mg/g{creat} (ref <30)
Microalb, Ur: 0.2 mg/dL

## 2023-12-04 ENCOUNTER — Ambulatory Visit: Payer: Self-pay | Admitting: Family Medicine

## 2023-12-05 ENCOUNTER — Encounter: Payer: Self-pay | Admitting: Urology

## 2023-12-05 ENCOUNTER — Ambulatory Visit: Admitting: Urology

## 2023-12-05 VITALS — BP 155/78 | HR 74 | Ht 65.0 in | Wt 151.0 lb

## 2023-12-05 DIAGNOSIS — R972 Elevated prostate specific antigen [PSA]: Secondary | ICD-10-CM

## 2023-12-05 NOTE — Progress Notes (Signed)
 12/05/2023 8:34 AM   Jack Cardenas 02-20-1965 [id number]  Referring provider: Sowles, Krichna, MD [address] [place],  [place] [address]  Chief Complaint  Patient presents with   Elevated PSA   Urologic history:  1.  Elevated PSA Prostate biopsy 12/2016; PSA 5.5; volume 33 g; benign pathology MR fusion biopsy 05/16/23; PSA 7.4; PI-RADS 3 lesion right anterior transition zone; path 15/15 cores benign  HPI: Jack Cardenas is a 58 y.o. male presents for 34-month follow-up.  No problems since last visit No bothersome LUTS Denies dysuria, gross hematuria PSA 11/29/2023 was 2.8  PSA trend    Prostate Specific Ag, Serum  Latest Ref Rng 0.0 - 4.0 ng/mL  09/20/2018 2.7   09/24/2019 2.4   09/23/2020 2.3   09/26/2021 3.5   10/03/2022 6.1 (H)   01/03/2023 7.4 (H)   11/29/2023 2.8     PMH: Past Medical History:  Diagnosis Date   Diabetes mellitus without complication (HCC)    Hyperlipidemia    Hypertension    MVP (mitral valve prolapse)    Dx'd as teenager. No issues.  Followed by PCP.    Surgical History: Past Surgical History:  Procedure Laterality Date   COLONOSCOPY WITH PROPOFOL  N/A 12/11/2014   Procedure: COLONOSCOPY WITH PROPOFOL ;  Surgeon: Rogelia Copping, MD;  Location: [facility];  Service: Endoscopy;  Laterality: N/A;  Diabetic - insulin    Home Medications:  Allergies as of 12/05/2023   No Known Allergies      Medication List        Accurate as of December 05, 2023  8:34 AM. If you have any questions, ask your nurse or doctor.          amLODipine-valsartan 5-160 MG tablet Commonly known as: EXFORGE Take 1 tablet by mouth daily.   aspirin  EC 81 MG tablet Take 81 mg by mouth daily.   atorvastatin 40 MG tablet Commonly known as: LIPITOR Take 1 tablet (40 mg total) by mouth daily.   Cholecalciferol 50 MCG (2000 UT) Caps Take 2,000 Units by mouth daily.   empagliflozin 25 MG Tabs tablet Commonly known as: JARDIANCE Take 1 tablet  by mouth daily.   ezetimibe 10 MG tablet Commonly known as: ZETIA Take 1 tablet (10 mg total) by mouth daily.   FISH OIL PO Take 2 capsules by mouth daily.   Insulin Pen Needle 32G X 4 MM Misc Use as directed for insulin injection once a day   Mounjaro 5 MG/0.5ML Pen Generic drug: tirzepatide Inject into the skin.   multivitamin tablet Take 1 tablet by mouth daily.   OneTouch Verio test strip Generic drug: glucose blood 1 each by Other route as needed.   Contour Next Test test strip Generic drug: glucose blood Use as instructed   pioglitazone 15 MG tablet Commonly known as: Actos Take 1 tablet (15 mg total) by mouth daily.        Allergies: No Known Allergies  Family History: Family History  Problem Relation Age of Onset   Renal Disease Father    Diabetes Father    Thyroid  disease Sister    Metabolic syndrome Sister    Eczema Sister    Prostate cancer Paternal Grandfather    Colon cancer Paternal Grandfather     Social History:  reports that he has never smoked. He has never used smokeless tobacco. He reports that he does not currently use alcohol. He reports that he does not use drugs.   Physical Exam: BP ROLLEN)  155/78   Pulse 74   Ht 5' 5 (1.651 m)   Wt 151 lb (68.5 kg)   BMI 25.13 kg/m   Constitutional:  Alert, No acute distress. HEENT: [place] AT Respiratory: Normal respiratory effort, no increased work of breathing.  Psychiatric: Normal mood and affect.   Assessment & Plan:    1.  History elevated PSA Most recent PSA was normal Will recheck 6 months lab visit only If PSA remains normal will move to annual visits or prn visits with PSA check by PCP depending on his preference   Glendia JAYSON Barba, MD  [facility] [address], [address] [place], [place] [address] [phone]

## 2024-02-24 ENCOUNTER — Encounter: Payer: Self-pay | Admitting: Family Medicine

## 2024-06-03 ENCOUNTER — Other Ambulatory Visit

## 2024-12-02 ENCOUNTER — Encounter: Admitting: Family Medicine
